# Patient Record
Sex: Female | Born: 1955 | Race: White | Hispanic: No | Marital: Married | Smoking: Never smoker
Health system: Southern US, Community
[De-identification: ages and names within clinical notes are randomized; demographics above are authoritative.]

## PROBLEM LIST (undated history)

## (undated) DIAGNOSIS — IMO0002 Reserved for concepts with insufficient information to code with codable children: Secondary | ICD-10-CM

## (undated) DIAGNOSIS — M21611 Bunion of right foot: Secondary | ICD-10-CM

## (undated) DIAGNOSIS — S83519A Sprain of anterior cruciate ligament of unspecified knee, initial encounter: Secondary | ICD-10-CM

## (undated) DIAGNOSIS — H409 Unspecified glaucoma: Secondary | ICD-10-CM

## (undated) DIAGNOSIS — A64 Unspecified sexually transmitted disease: Secondary | ICD-10-CM

## (undated) DIAGNOSIS — E041 Nontoxic single thyroid nodule: Secondary | ICD-10-CM

## (undated) DIAGNOSIS — M8580 Other specified disorders of bone density and structure, unspecified site: Secondary | ICD-10-CM

## (undated) DIAGNOSIS — M779 Enthesopathy, unspecified: Secondary | ICD-10-CM

## (undated) DIAGNOSIS — M26609 Unspecified temporomandibular joint disorder, unspecified side: Secondary | ICD-10-CM

## (undated) DIAGNOSIS — C44310 Basal cell carcinoma of skin of unspecified parts of face: Secondary | ICD-10-CM

## (undated) DIAGNOSIS — M21612 Bunion of left foot: Secondary | ICD-10-CM

## (undated) DIAGNOSIS — C50919 Malignant neoplasm of unspecified site of unspecified female breast: Secondary | ICD-10-CM

## (undated) DIAGNOSIS — C4491 Basal cell carcinoma of skin, unspecified: Secondary | ICD-10-CM

## (undated) HISTORY — DX: Unspecified glaucoma: H40.9

## (undated) HISTORY — PX: CATARACT EXTRACTION: SUR2

## (undated) HISTORY — DX: Enthesopathy, unspecified: M77.9

## (undated) HISTORY — DX: Basal cell carcinoma of skin, unspecified: C44.91

## (undated) HISTORY — DX: Other specified disorders of bone density and structure, unspecified site: M85.80

## (undated) HISTORY — DX: Nontoxic single thyroid nodule: E04.1

## (undated) HISTORY — DX: Unspecified sexually transmitted disease: A64

## (undated) HISTORY — DX: Sprain of anterior cruciate ligament of unspecified knee, initial encounter: S83.519A

## (undated) HISTORY — DX: Basal cell carcinoma of skin of unspecified parts of face: C44.310

## (undated) HISTORY — PX: EXCISION / BIOPSY BREAST / NIPPLE / DUCT: SUR469

## (undated) HISTORY — DX: Malignant neoplasm of unspecified site of unspecified female breast: C50.919

## (undated) HISTORY — DX: Unspecified temporomandibular joint disorder, unspecified side: M26.609

## (undated) HISTORY — DX: Bunion of right foot: M21.611

## (undated) HISTORY — DX: Reserved for concepts with insufficient information to code with codable children: IMO0002

## (undated) HISTORY — DX: Bunion of left foot: M21.612

---

## 1980-03-03 HISTORY — PX: TENDON REPAIR: SHX5111

## 1980-03-03 HISTORY — PX: WRIST SURGERY: SHX841

## 1985-03-03 HISTORY — PX: DILATION AND CURETTAGE OF UTERUS: SHX78

## 1990-03-03 HISTORY — PX: TUBAL LIGATION: SHX77

## 1992-03-03 DIAGNOSIS — M21611 Bunion of right foot: Secondary | ICD-10-CM

## 1992-03-03 DIAGNOSIS — M21612 Bunion of left foot: Secondary | ICD-10-CM

## 1992-03-03 HISTORY — DX: Bunion of right foot: M21.611

## 1992-03-03 HISTORY — DX: Bunion of right foot: M21.612

## 1992-03-03 HISTORY — PX: OTHER SURGICAL HISTORY: SHX169

## 1998-03-03 DIAGNOSIS — A64 Unspecified sexually transmitted disease: Secondary | ICD-10-CM

## 1998-03-03 HISTORY — DX: Unspecified sexually transmitted disease: A64

## 1998-03-13 ENCOUNTER — Other Ambulatory Visit: Admission: RE | Admit: 1998-03-13 | Discharge: 1998-03-13 | Payer: Self-pay | Admitting: Obstetrics and Gynecology

## 1998-04-27 ENCOUNTER — Encounter: Payer: Self-pay | Admitting: Obstetrics and Gynecology

## 1998-04-27 ENCOUNTER — Ambulatory Visit (HOSPITAL_COMMUNITY): Admission: RE | Admit: 1998-04-27 | Discharge: 1998-04-27 | Payer: Self-pay | Admitting: Obstetrics and Gynecology

## 1999-03-04 HISTORY — PX: ARTHROSCOPIC REPAIR ACL: SUR80

## 1999-04-19 ENCOUNTER — Other Ambulatory Visit: Admission: RE | Admit: 1999-04-19 | Discharge: 1999-04-19 | Payer: Self-pay | Admitting: Obstetrics and Gynecology

## 1999-05-24 ENCOUNTER — Encounter: Admission: RE | Admit: 1999-05-24 | Discharge: 1999-05-24 | Payer: Self-pay | Admitting: Orthopedic Surgery

## 1999-05-24 ENCOUNTER — Encounter: Payer: Self-pay | Admitting: Orthopedic Surgery

## 1999-08-07 ENCOUNTER — Encounter: Payer: Self-pay | Admitting: Obstetrics and Gynecology

## 1999-08-07 ENCOUNTER — Ambulatory Visit (HOSPITAL_COMMUNITY): Admission: RE | Admit: 1999-08-07 | Discharge: 1999-08-07 | Payer: Self-pay | Admitting: Obstetrics and Gynecology

## 1999-08-14 ENCOUNTER — Encounter: Admission: RE | Admit: 1999-08-14 | Discharge: 1999-08-14 | Payer: Self-pay | Admitting: Interventional Cardiology

## 1999-08-14 ENCOUNTER — Encounter: Payer: Self-pay | Admitting: Obstetrics and Gynecology

## 2000-06-08 ENCOUNTER — Other Ambulatory Visit: Admission: RE | Admit: 2000-06-08 | Discharge: 2000-06-08 | Payer: Self-pay | Admitting: Obstetrics and Gynecology

## 2001-06-17 ENCOUNTER — Ambulatory Visit (HOSPITAL_COMMUNITY): Admission: RE | Admit: 2001-06-17 | Discharge: 2001-06-17 | Payer: Self-pay | Admitting: Obstetrics and Gynecology

## 2001-06-17 ENCOUNTER — Encounter: Payer: Self-pay | Admitting: Obstetrics and Gynecology

## 2001-08-17 ENCOUNTER — Other Ambulatory Visit: Admission: RE | Admit: 2001-08-17 | Discharge: 2001-08-17 | Payer: Self-pay | Admitting: Obstetrics and Gynecology

## 2002-06-22 ENCOUNTER — Ambulatory Visit (HOSPITAL_COMMUNITY): Admission: RE | Admit: 2002-06-22 | Discharge: 2002-06-22 | Payer: Self-pay | Admitting: Obstetrics and Gynecology

## 2002-06-22 ENCOUNTER — Encounter: Payer: Self-pay | Admitting: Obstetrics and Gynecology

## 2002-08-19 ENCOUNTER — Other Ambulatory Visit: Admission: RE | Admit: 2002-08-19 | Discharge: 2002-08-19 | Payer: Self-pay | Admitting: Obstetrics and Gynecology

## 2003-08-08 ENCOUNTER — Encounter: Admission: RE | Admit: 2003-08-08 | Discharge: 2003-08-08 | Payer: Self-pay | Admitting: Obstetrics and Gynecology

## 2003-08-21 ENCOUNTER — Other Ambulatory Visit: Admission: RE | Admit: 2003-08-21 | Discharge: 2003-08-21 | Payer: Self-pay | Admitting: Obstetrics and Gynecology

## 2004-08-22 ENCOUNTER — Other Ambulatory Visit: Admission: RE | Admit: 2004-08-22 | Discharge: 2004-08-22 | Payer: Self-pay | Admitting: Obstetrics and Gynecology

## 2005-01-08 ENCOUNTER — Ambulatory Visit (HOSPITAL_COMMUNITY): Admission: RE | Admit: 2005-01-08 | Discharge: 2005-01-08 | Payer: Self-pay | Admitting: Obstetrics and Gynecology

## 2005-08-26 ENCOUNTER — Other Ambulatory Visit: Admission: RE | Admit: 2005-08-26 | Discharge: 2005-08-26 | Payer: Self-pay | Admitting: Obstetrics & Gynecology

## 2006-01-28 ENCOUNTER — Ambulatory Visit (HOSPITAL_COMMUNITY): Admission: RE | Admit: 2006-01-28 | Discharge: 2006-01-28 | Payer: Self-pay | Admitting: Obstetrics and Gynecology

## 2006-02-19 ENCOUNTER — Ambulatory Visit (HOSPITAL_COMMUNITY): Admission: RE | Admit: 2006-02-19 | Discharge: 2006-02-19 | Payer: Self-pay | Admitting: Obstetrics and Gynecology

## 2006-03-23 ENCOUNTER — Encounter: Admission: RE | Admit: 2006-03-23 | Discharge: 2006-03-23 | Payer: Self-pay | Admitting: Obstetrics & Gynecology

## 2006-08-31 ENCOUNTER — Other Ambulatory Visit: Admission: RE | Admit: 2006-08-31 | Discharge: 2006-08-31 | Payer: Self-pay | Admitting: Obstetrics and Gynecology

## 2007-02-01 ENCOUNTER — Ambulatory Visit (HOSPITAL_COMMUNITY): Admission: RE | Admit: 2007-02-01 | Discharge: 2007-02-01 | Payer: Self-pay | Admitting: Obstetrics & Gynecology

## 2007-03-04 DIAGNOSIS — C50919 Malignant neoplasm of unspecified site of unspecified female breast: Secondary | ICD-10-CM

## 2007-03-04 HISTORY — PX: HYSTEROSCOPY WITH D & C: SHX1775

## 2007-03-04 HISTORY — PX: BREAST LUMPECTOMY: SHX2

## 2007-03-04 HISTORY — DX: Malignant neoplasm of unspecified site of unspecified female breast: C50.919

## 2007-03-17 ENCOUNTER — Encounter: Admission: RE | Admit: 2007-03-17 | Discharge: 2007-03-17 | Payer: Self-pay | Admitting: Obstetrics and Gynecology

## 2007-03-29 ENCOUNTER — Encounter (INDEPENDENT_AMBULATORY_CARE_PROVIDER_SITE_OTHER): Payer: Self-pay | Admitting: Diagnostic Radiology

## 2007-03-29 ENCOUNTER — Encounter: Admission: RE | Admit: 2007-03-29 | Discharge: 2007-03-29 | Payer: Self-pay | Admitting: Obstetrics and Gynecology

## 2007-04-08 ENCOUNTER — Encounter: Admission: RE | Admit: 2007-04-08 | Discharge: 2007-04-08 | Payer: Self-pay | Admitting: Obstetrics and Gynecology

## 2007-04-29 ENCOUNTER — Encounter: Admission: RE | Admit: 2007-04-29 | Discharge: 2007-04-29 | Payer: Self-pay | Admitting: Surgery

## 2007-04-30 ENCOUNTER — Encounter (INDEPENDENT_AMBULATORY_CARE_PROVIDER_SITE_OTHER): Payer: Self-pay | Admitting: Surgery

## 2007-04-30 ENCOUNTER — Ambulatory Visit (HOSPITAL_BASED_OUTPATIENT_CLINIC_OR_DEPARTMENT_OTHER): Admission: RE | Admit: 2007-04-30 | Discharge: 2007-04-30 | Payer: Self-pay | Admitting: Surgery

## 2007-04-30 ENCOUNTER — Encounter: Admission: RE | Admit: 2007-04-30 | Discharge: 2007-04-30 | Payer: Self-pay | Admitting: Surgery

## 2007-04-30 HISTORY — PX: BREAST LUMPECTOMY WITH AXILLARY LYMPH NODE BIOPSY: SHX5593

## 2007-05-11 ENCOUNTER — Ambulatory Visit: Payer: Self-pay | Admitting: Oncology

## 2007-05-26 LAB — COMPREHENSIVE METABOLIC PANEL
ALT: 29 U/L (ref 0–35)
AST: 24 U/L (ref 0–37)
Albumin: 4.7 g/dL (ref 3.5–5.2)
Alkaline Phosphatase: 74 U/L (ref 39–117)
BUN: 14 mg/dL (ref 6–23)
CO2: 29 mEq/L (ref 19–32)
Calcium: 9.9 mg/dL (ref 8.4–10.5)
Chloride: 101 mEq/L (ref 96–112)
Creatinine, Ser: 0.85 mg/dL (ref 0.40–1.20)
Glucose, Bld: 90 mg/dL (ref 70–99)
Potassium: 4 mEq/L (ref 3.5–5.3)
Sodium: 136 mEq/L (ref 135–145)
Total Bilirubin: 0.7 mg/dL (ref 0.3–1.2)
Total Protein: 7.3 g/dL (ref 6.0–8.3)

## 2007-05-26 LAB — LACTATE DEHYDROGENASE: LDH: 157 U/L (ref 94–250)

## 2007-05-26 LAB — CBC WITH DIFFERENTIAL/PLATELET
BASO%: 0.3 % (ref 0.0–2.0)
Basophils Absolute: 0 10*3/uL (ref 0.0–0.1)
EOS%: 2.6 % (ref 0.0–7.0)
Eosinophils Absolute: 0.1 10*3/uL (ref 0.0–0.5)
HCT: 39 % (ref 34.8–46.6)
HGB: 13.6 g/dL (ref 11.6–15.9)
LYMPH%: 35.1 % (ref 14.0–48.0)
MCH: 30.9 pg (ref 26.0–34.0)
MCHC: 35 g/dL (ref 32.0–36.0)
MCV: 88.1 fL (ref 81.0–101.0)
MONO#: 0.4 10*3/uL (ref 0.1–0.9)
MONO%: 8.9 % (ref 0.0–13.0)
NEUT#: 2.5 10*3/uL (ref 1.5–6.5)
NEUT%: 53.1 % (ref 39.6–76.8)
Platelets: 187 10*3/uL (ref 145–400)
RBC: 4.43 10*6/uL (ref 3.70–5.32)
RDW: 12.5 % (ref 11.3–14.5)
WBC: 4.8 10*3/uL (ref 3.9–10.0)
lymph#: 1.7 10*3/uL (ref 0.9–3.3)

## 2007-05-26 LAB — CANCER ANTIGEN 27.29: CA 27.29: 20 U/mL (ref 0–39)

## 2007-05-27 LAB — VITAMIN D 25 HYDROXY (VIT D DEFICIENCY, FRACTURES): Vit D, 25-Hydroxy: 36 ng/mL (ref 30–89)

## 2007-05-29 LAB — VITAMIN D 1,25 DIHYDROXY: Vit D, 1,25-Dihydroxy: 84 pg/mL — ABNORMAL HIGH (ref 6–62)

## 2007-06-10 ENCOUNTER — Ambulatory Visit: Admission: RE | Admit: 2007-06-10 | Discharge: 2007-09-08 | Payer: Self-pay | Admitting: Radiation Oncology

## 2007-08-10 ENCOUNTER — Ambulatory Visit: Payer: Self-pay | Admitting: Oncology

## 2007-09-07 ENCOUNTER — Other Ambulatory Visit: Admission: RE | Admit: 2007-09-07 | Discharge: 2007-09-07 | Payer: Self-pay | Admitting: Obstetrics and Gynecology

## 2007-10-29 ENCOUNTER — Ambulatory Visit: Payer: Self-pay | Admitting: Oncology

## 2007-11-02 ENCOUNTER — Ambulatory Visit (HOSPITAL_COMMUNITY): Admission: RE | Admit: 2007-11-02 | Discharge: 2007-11-02 | Payer: Self-pay | Admitting: Obstetrics & Gynecology

## 2007-11-03 LAB — CBC WITH DIFFERENTIAL/PLATELET
BASO%: 0.5 % (ref 0.0–2.0)
Basophils Absolute: 0 10*3/uL (ref 0.0–0.1)
EOS%: 3 % (ref 0.0–7.0)
Eosinophils Absolute: 0.1 10*3/uL (ref 0.0–0.5)
HCT: 39.9 % (ref 34.8–46.6)
HGB: 13.6 g/dL (ref 11.6–15.9)
LYMPH%: 31.6 % (ref 14.0–48.0)
MCH: 30.4 pg (ref 26.0–34.0)
MCHC: 34.1 g/dL (ref 32.0–36.0)
MCV: 89.2 fL (ref 81.0–101.0)
MONO#: 0.3 10*3/uL (ref 0.1–0.9)
MONO%: 8.4 % (ref 0.0–13.0)
NEUT#: 2.1 10*3/uL (ref 1.5–6.5)
NEUT%: 56.5 % (ref 39.6–76.8)
Platelets: 142 10*3/uL — ABNORMAL LOW (ref 145–400)
RBC: 4.47 10*6/uL (ref 3.70–5.32)
RDW: 12.4 % (ref 11.3–14.5)
WBC: 3.7 10*3/uL — ABNORMAL LOW (ref 3.9–10.0)
lymph#: 1.2 10*3/uL (ref 0.9–3.3)

## 2007-11-04 LAB — COMPREHENSIVE METABOLIC PANEL
ALT: 20 U/L (ref 0–35)
AST: 18 U/L (ref 0–37)
Albumin: 4.4 g/dL (ref 3.5–5.2)
Alkaline Phosphatase: 68 U/L (ref 39–117)
BUN: 17 mg/dL (ref 6–23)
CO2: 26 mEq/L (ref 19–32)
Calcium: 9.5 mg/dL (ref 8.4–10.5)
Chloride: 103 mEq/L (ref 96–112)
Creatinine, Ser: 0.92 mg/dL (ref 0.40–1.20)
Glucose, Bld: 88 mg/dL (ref 70–99)
Potassium: 4.3 mEq/L (ref 3.5–5.3)
Sodium: 139 mEq/L (ref 135–145)
Total Bilirubin: 0.7 mg/dL (ref 0.3–1.2)
Total Protein: 6.9 g/dL (ref 6.0–8.3)

## 2007-11-04 LAB — CANCER ANTIGEN 27.29: CA 27.29: 17 U/mL (ref 0–39)

## 2007-11-04 LAB — LACTATE DEHYDROGENASE: LDH: 141 U/L (ref 94–250)

## 2007-11-04 LAB — VITAMIN D 25 HYDROXY (VIT D DEFICIENCY, FRACTURES): Vit D, 25-Hydroxy: 38 ng/mL (ref 30–89)

## 2007-11-10 ENCOUNTER — Ambulatory Visit (HOSPITAL_COMMUNITY): Admission: RE | Admit: 2007-11-10 | Discharge: 2007-11-10 | Payer: Self-pay | Admitting: Oncology

## 2008-02-23 ENCOUNTER — Encounter: Admission: RE | Admit: 2008-02-23 | Discharge: 2008-02-23 | Payer: Self-pay | Admitting: Oncology

## 2008-05-03 ENCOUNTER — Ambulatory Visit: Payer: Self-pay | Admitting: Oncology

## 2008-05-05 LAB — CBC WITH DIFFERENTIAL/PLATELET
BASO%: 0.5 % (ref 0.0–2.0)
Basophils Absolute: 0 10*3/uL (ref 0.0–0.1)
EOS%: 1.2 % (ref 0.0–7.0)
Eosinophils Absolute: 0.1 10*3/uL (ref 0.0–0.5)
HCT: 36.3 % (ref 34.8–46.6)
HGB: 12.3 g/dL (ref 11.6–15.9)
LYMPH%: 36 % (ref 14.0–49.7)
MCH: 30.3 pg (ref 25.1–34.0)
MCHC: 34 g/dL (ref 31.5–36.0)
MCV: 89.4 fL (ref 79.5–101.0)
MONO#: 0.3 10*3/uL (ref 0.1–0.9)
MONO%: 6.4 % (ref 0.0–14.0)
NEUT#: 2.9 10*3/uL (ref 1.5–6.5)
NEUT%: 55.9 % (ref 38.4–76.8)
Platelets: 176 10*3/uL (ref 145–400)
RBC: 4.06 10*6/uL (ref 3.70–5.45)
RDW: 12.2 % (ref 11.2–14.5)
WBC: 5.1 10*3/uL (ref 3.9–10.3)
lymph#: 1.9 10*3/uL (ref 0.9–3.3)

## 2008-05-08 LAB — CANCER ANTIGEN 27.29: CA 27.29: 20 U/mL (ref 0–39)

## 2008-05-08 LAB — LACTATE DEHYDROGENASE: LDH: 144 U/L (ref 94–250)

## 2008-05-08 LAB — COMPREHENSIVE METABOLIC PANEL
ALT: 20 U/L (ref 0–35)
AST: 20 U/L (ref 0–37)
Albumin: 4.4 g/dL (ref 3.5–5.2)
Alkaline Phosphatase: 77 U/L (ref 39–117)
BUN: 13 mg/dL (ref 6–23)
CO2: 27 mEq/L (ref 19–32)
Calcium: 9.4 mg/dL (ref 8.4–10.5)
Chloride: 102 mEq/L (ref 96–112)
Creatinine, Ser: 0.83 mg/dL (ref 0.40–1.20)
Glucose, Bld: 122 mg/dL — ABNORMAL HIGH (ref 70–99)
Potassium: 4 mEq/L (ref 3.5–5.3)
Sodium: 141 mEq/L (ref 135–145)
Total Bilirubin: 0.4 mg/dL (ref 0.3–1.2)
Total Protein: 6.6 g/dL (ref 6.0–8.3)

## 2008-05-08 LAB — VITAMIN D 25 HYDROXY (VIT D DEFICIENCY, FRACTURES): Vit D, 25-Hydroxy: 46 ng/mL (ref 30–89)

## 2008-10-03 ENCOUNTER — Encounter: Admission: RE | Admit: 2008-10-03 | Discharge: 2008-11-21 | Payer: Self-pay | Admitting: Emergency Medicine

## 2008-11-10 ENCOUNTER — Ambulatory Visit: Payer: Self-pay | Admitting: Oncology

## 2008-11-14 LAB — CBC WITH DIFFERENTIAL/PLATELET
BASO%: 0.7 % (ref 0.0–2.0)
Basophils Absolute: 0 10*3/uL (ref 0.0–0.1)
EOS%: 1.3 % (ref 0.0–7.0)
Eosinophils Absolute: 0.1 10*3/uL (ref 0.0–0.5)
HCT: 39.4 % (ref 34.8–46.6)
HGB: 13.5 g/dL (ref 11.6–15.9)
LYMPH%: 42.1 % (ref 14.0–49.7)
MCH: 29.7 pg (ref 25.1–34.0)
MCHC: 34.3 g/dL (ref 31.5–36.0)
MCV: 86.6 fL (ref 79.5–101.0)
MONO#: 0.3 10*3/uL (ref 0.1–0.9)
MONO%: 7.1 % (ref 0.0–14.0)
NEUT#: 2.2 10*3/uL (ref 1.5–6.5)
NEUT%: 48.8 % (ref 38.4–76.8)
Platelets: 167 10*3/uL (ref 145–400)
RBC: 4.55 10*6/uL (ref 3.70–5.45)
RDW: 12.6 % (ref 11.2–14.5)
WBC: 4.5 10*3/uL (ref 3.9–10.3)
lymph#: 1.9 10*3/uL (ref 0.9–3.3)
nRBC: 0 % (ref 0–0)

## 2008-11-15 LAB — COMPREHENSIVE METABOLIC PANEL
ALT: 20 U/L (ref 0–35)
AST: 20 U/L (ref 0–37)
Albumin: 4.6 g/dL (ref 3.5–5.2)
Alkaline Phosphatase: 71 U/L (ref 39–117)
BUN: 13 mg/dL (ref 6–23)
CO2: 26 mEq/L (ref 19–32)
Calcium: 9.2 mg/dL (ref 8.4–10.5)
Chloride: 104 mEq/L (ref 96–112)
Creatinine, Ser: 0.98 mg/dL (ref 0.40–1.20)
Glucose, Bld: 103 mg/dL — ABNORMAL HIGH (ref 70–99)
Potassium: 3.7 mEq/L (ref 3.5–5.3)
Sodium: 142 mEq/L (ref 135–145)
Total Bilirubin: 0.6 mg/dL (ref 0.3–1.2)
Total Protein: 7.1 g/dL (ref 6.0–8.3)

## 2008-11-15 LAB — VITAMIN D 25 HYDROXY (VIT D DEFICIENCY, FRACTURES): Vit D, 25-Hydroxy: 54 ng/mL (ref 30–89)

## 2008-11-15 LAB — LACTATE DEHYDROGENASE: LDH: 150 U/L (ref 94–250)

## 2008-11-15 LAB — CANCER ANTIGEN 27.29: CA 27.29: 21 U/mL (ref 0–39)

## 2009-02-26 ENCOUNTER — Encounter: Admission: RE | Admit: 2009-02-26 | Discharge: 2009-02-26 | Payer: Self-pay | Admitting: Oncology

## 2009-03-26 ENCOUNTER — Ambulatory Visit (HOSPITAL_COMMUNITY): Admission: RE | Admit: 2009-03-26 | Discharge: 2009-03-26 | Payer: Self-pay | Admitting: Obstetrics & Gynecology

## 2009-05-11 ENCOUNTER — Ambulatory Visit: Payer: Self-pay | Admitting: Oncology

## 2009-05-15 LAB — CBC WITH DIFFERENTIAL/PLATELET
BASO%: 0.3 % (ref 0.0–2.0)
Basophils Absolute: 0 10*3/uL (ref 0.0–0.1)
EOS%: 0.5 % (ref 0.0–7.0)
Eosinophils Absolute: 0 10*3/uL (ref 0.0–0.5)
HCT: 38.4 % (ref 34.8–46.6)
HGB: 13.1 g/dL (ref 11.6–15.9)
LYMPH%: 19 % (ref 14.0–49.7)
MCH: 31 pg (ref 25.1–34.0)
MCHC: 34 g/dL (ref 31.5–36.0)
MCV: 91 fL (ref 79.5–101.0)
MONO#: 0.4 10*3/uL (ref 0.1–0.9)
MONO%: 5.4 % (ref 0.0–14.0)
NEUT#: 5.8 10*3/uL (ref 1.5–6.5)
NEUT%: 74.8 % (ref 38.4–76.8)
Platelets: 151 10*3/uL (ref 145–400)
RBC: 4.22 10*6/uL (ref 3.70–5.45)
RDW: 12.2 % (ref 11.2–14.5)
WBC: 7.8 10*3/uL (ref 3.9–10.3)
lymph#: 1.5 10*3/uL (ref 0.9–3.3)

## 2009-05-15 LAB — COMPREHENSIVE METABOLIC PANEL
ALT: 16 U/L (ref 0–35)
AST: 21 U/L (ref 0–37)
Albumin: 4.8 g/dL (ref 3.5–5.2)
Alkaline Phosphatase: 77 U/L (ref 39–117)
BUN: 13 mg/dL (ref 6–23)
CO2: 26 mEq/L (ref 19–32)
Calcium: 9.7 mg/dL (ref 8.4–10.5)
Chloride: 103 mEq/L (ref 96–112)
Creatinine, Ser: 0.79 mg/dL (ref 0.40–1.20)
Glucose, Bld: 85 mg/dL (ref 70–99)
Potassium: 4 mEq/L (ref 3.5–5.3)
Sodium: 140 mEq/L (ref 135–145)
Total Bilirubin: 0.7 mg/dL (ref 0.3–1.2)
Total Protein: 7.1 g/dL (ref 6.0–8.3)

## 2009-05-15 LAB — VITAMIN D 25 HYDROXY (VIT D DEFICIENCY, FRACTURES): Vit D, 25-Hydroxy: 54 ng/mL (ref 30–89)

## 2009-05-15 LAB — CANCER ANTIGEN 27.29: CA 27.29: 11 U/mL (ref 0–39)

## 2009-05-15 LAB — LACTATE DEHYDROGENASE: LDH: 159 U/L (ref 94–250)

## 2009-12-19 ENCOUNTER — Ambulatory Visit: Payer: Self-pay | Admitting: Oncology

## 2009-12-21 LAB — CBC WITH DIFFERENTIAL/PLATELET
BASO%: 1 % (ref 0.0–2.0)
Basophils Absolute: 0 10*3/uL (ref 0.0–0.1)
EOS%: 2.5 % (ref 0.0–7.0)
Eosinophils Absolute: 0.1 10*3/uL (ref 0.0–0.5)
HCT: 39.4 % (ref 34.8–46.6)
HGB: 13.5 g/dL (ref 11.6–15.9)
LYMPH%: 41.5 % (ref 14.0–49.7)
MCH: 30.8 pg (ref 25.1–34.0)
MCHC: 34.2 g/dL (ref 31.5–36.0)
MCV: 90 fL (ref 79.5–101.0)
MONO#: 0.3 10*3/uL (ref 0.1–0.9)
MONO%: 8.4 % (ref 0.0–14.0)
NEUT#: 1.9 10*3/uL (ref 1.5–6.5)
NEUT%: 46.6 % (ref 38.4–76.8)
Platelets: 173 10*3/uL (ref 145–400)
RBC: 4.38 10*6/uL (ref 3.70–5.45)
RDW: 12.5 % (ref 11.2–14.5)
WBC: 4.1 10*3/uL (ref 3.9–10.3)
lymph#: 1.7 10*3/uL (ref 0.9–3.3)

## 2009-12-22 LAB — COMPREHENSIVE METABOLIC PANEL
ALT: 14 U/L (ref 0–35)
AST: 19 U/L (ref 0–37)
Albumin: 4.3 g/dL (ref 3.5–5.2)
Alkaline Phosphatase: 81 U/L (ref 39–117)
BUN: 15 mg/dL (ref 6–23)
CO2: 25 mEq/L (ref 19–32)
Calcium: 9.7 mg/dL (ref 8.4–10.5)
Chloride: 105 mEq/L (ref 96–112)
Creatinine, Ser: 1 mg/dL (ref 0.40–1.20)
Glucose, Bld: 71 mg/dL (ref 70–99)
Potassium: 4.1 mEq/L (ref 3.5–5.3)
Sodium: 144 mEq/L (ref 135–145)
Total Bilirubin: 0.5 mg/dL (ref 0.3–1.2)
Total Protein: 6.5 g/dL (ref 6.0–8.3)

## 2009-12-22 LAB — CANCER ANTIGEN 27.29: CA 27.29: 18 U/mL (ref 0–39)

## 2009-12-22 LAB — LACTATE DEHYDROGENASE: LDH: 145 U/L (ref 94–250)

## 2009-12-22 LAB — VITAMIN D 25 HYDROXY (VIT D DEFICIENCY, FRACTURES): Vit D, 25-Hydroxy: 62 ng/mL (ref 30–89)

## 2010-03-06 ENCOUNTER — Encounter
Admission: RE | Admit: 2010-03-06 | Discharge: 2010-03-06 | Payer: Self-pay | Source: Home / Self Care | Attending: Oncology | Admitting: Oncology

## 2010-03-24 ENCOUNTER — Encounter: Payer: Self-pay | Admitting: General Surgery

## 2010-03-24 ENCOUNTER — Encounter: Payer: Self-pay | Admitting: Oncology

## 2010-05-20 LAB — URINALYSIS, ROUTINE W REFLEX MICROSCOPIC
Bilirubin Urine: NEGATIVE
Glucose, UA: NEGATIVE mg/dL
Hgb urine dipstick: NEGATIVE
Ketones, ur: NEGATIVE mg/dL
Nitrite: NEGATIVE
Protein, ur: NEGATIVE mg/dL
Specific Gravity, Urine: 1.02 (ref 1.005–1.030)
Urobilinogen, UA: 0.2 mg/dL (ref 0.0–1.0)
pH: 5.5 (ref 5.0–8.0)

## 2010-05-20 LAB — CBC
HCT: 38.2 % (ref 36.0–46.0)
Hemoglobin: 12.8 g/dL (ref 12.0–15.0)
MCHC: 33.6 g/dL (ref 30.0–36.0)
MCV: 90.9 fL (ref 78.0–100.0)
Platelets: 141 10*3/uL — ABNORMAL LOW (ref 150–400)
RBC: 4.2 MIL/uL (ref 3.87–5.11)
RDW: 13 % (ref 11.5–15.5)
WBC: 4.1 10*3/uL (ref 4.0–10.5)

## 2010-05-20 LAB — BASIC METABOLIC PANEL
BUN: 13 mg/dL (ref 6–23)
CO2: 29 mEq/L (ref 19–32)
Calcium: 9.7 mg/dL (ref 8.4–10.5)
Chloride: 104 mEq/L (ref 96–112)
Creatinine, Ser: 0.78 mg/dL (ref 0.4–1.2)
GFR calc Af Amer: >60 mL/min (ref >60)
GFR calc non Af Amer: >60 mL/min (ref >60)
Glucose, Bld: 93 mg/dL (ref 70–99)
Potassium: 4.2 mEq/L (ref 3.5–5.1)
Sodium: 140 mEq/L (ref 135–145)

## 2010-05-20 LAB — URINE MICROSCOPIC-ADD ON

## 2010-06-03 LAB — URINALYSIS, ROUTINE W REFLEX MICROSCOPIC
Bilirubin Urine: NEGATIVE
Glucose, UA: NEGATIVE mg/dL
Hgb urine dipstick: NEGATIVE
Ketones, ur: NEGATIVE mg/dL
Nitrite: NEGATIVE
Protein, ur: NEGATIVE mg/dL
Specific Gravity, Urine: 1.01 (ref 1.005–1.030)
Urobilinogen, UA: 0.2 mg/dL (ref 0.0–1.0)
pH: 7 (ref 5.0–8.0)

## 2010-06-03 LAB — BASIC METABOLIC PANEL
BUN: 14 mg/dL (ref 6–23)
CO2: 29 mEq/L (ref 19–32)
Calcium: 9.4 mg/dL (ref 8.4–10.5)
Chloride: 103 mEq/L (ref 96–112)
Creatinine, Ser: 0.78 mg/dL (ref 0.4–1.2)
GFR calc Af Amer: >60 mL/min (ref >60)
GFR calc non Af Amer: >60 mL/min (ref >60)
Glucose, Bld: 89 mg/dL (ref 70–99)
Potassium: 3.7 mEq/L (ref 3.5–5.1)
Sodium: 137 mEq/L (ref 135–145)

## 2010-06-03 LAB — CBC
HCT: 38 % (ref 36.0–46.0)
Hemoglobin: 12.7 g/dL (ref 12.0–15.0)
MCHC: 33.4 g/dL (ref 30.0–36.0)
MCV: 90.6 fL (ref 78.0–100.0)
Platelets: 148 10*3/uL — ABNORMAL LOW (ref 150–400)
RBC: 4.2 MIL/uL (ref 3.87–5.11)
RDW: 12.8 % (ref 11.5–15.5)
WBC: 4.6 10*3/uL (ref 4.0–10.5)

## 2010-07-04 ENCOUNTER — Other Ambulatory Visit: Payer: Self-pay | Admitting: Oncology

## 2010-07-04 ENCOUNTER — Encounter (HOSPITAL_BASED_OUTPATIENT_CLINIC_OR_DEPARTMENT_OTHER): Payer: BC Managed Care – PPO | Admitting: Oncology

## 2010-07-04 DIAGNOSIS — Z170 Estrogen receptor positive status [ER+]: Secondary | ICD-10-CM

## 2010-07-04 DIAGNOSIS — C50519 Malignant neoplasm of lower-outer quadrant of unspecified female breast: Secondary | ICD-10-CM

## 2010-07-04 DIAGNOSIS — IMO0002 Reserved for concepts with insufficient information to code with codable children: Secondary | ICD-10-CM

## 2010-07-04 LAB — CBC WITH DIFFERENTIAL/PLATELET
BASO%: 0.6 % (ref 0.0–2.0)
Basophils Absolute: 0 10*3/uL (ref 0.0–0.1)
EOS%: 1 % (ref 0.0–7.0)
Eosinophils Absolute: 0.1 10*3/uL (ref 0.0–0.5)
HCT: 36.8 % (ref 34.8–46.6)
HGB: 12.5 g/dL (ref 11.6–15.9)
LYMPH%: 29.2 % (ref 14.0–49.7)
MCH: 31.1 pg (ref 25.1–34.0)
MCHC: 34 g/dL (ref 31.5–36.0)
MCV: 91.4 fL (ref 79.5–101.0)
MONO#: 0.5 10*3/uL (ref 0.1–0.9)
MONO%: 7.9 % (ref 0.0–14.0)
NEUT#: 4 10*3/uL (ref 1.5–6.5)
NEUT%: 61.3 % (ref 38.4–76.8)
Platelets: 141 10*3/uL — ABNORMAL LOW (ref 145–400)
RBC: 4.02 10*6/uL (ref 3.70–5.45)
RDW: 12.6 % (ref 11.2–14.5)
WBC: 6.5 10*3/uL (ref 3.9–10.3)
lymph#: 1.9 10*3/uL (ref 0.9–3.3)

## 2010-07-05 LAB — COMPREHENSIVE METABOLIC PANEL
ALT: 14 U/L (ref 0–35)
AST: 20 U/L (ref 0–37)
Albumin: 4.2 g/dL (ref 3.5–5.2)
Alkaline Phosphatase: 58 U/L (ref 39–117)
BUN: 13 mg/dL (ref 6–23)
CO2: 27 mEq/L (ref 19–32)
Calcium: 9.4 mg/dL (ref 8.4–10.5)
Chloride: 105 mEq/L (ref 96–112)
Creatinine, Ser: 0.85 mg/dL (ref 0.40–1.20)
Glucose, Bld: 86 mg/dL (ref 70–99)
Potassium: 4.4 mEq/L (ref 3.5–5.3)
Sodium: 143 mEq/L (ref 135–145)
Total Bilirubin: 0.4 mg/dL (ref 0.3–1.2)
Total Protein: 6.5 g/dL (ref 6.0–8.3)

## 2010-07-05 LAB — VITAMIN D 25 HYDROXY (VIT D DEFICIENCY, FRACTURES): Vit D, 25-Hydroxy: 54 ng/mL (ref 30–89)

## 2010-07-05 LAB — CANCER ANTIGEN 27.29: CA 27.29: 15 U/mL (ref 0–39)

## 2010-07-09 ENCOUNTER — Encounter (HOSPITAL_BASED_OUTPATIENT_CLINIC_OR_DEPARTMENT_OTHER): Payer: BC Managed Care – PPO | Admitting: Oncology

## 2010-07-16 NOTE — Op Note (Signed)
Michele Cabrera, Michele Cabrera               ACCOUNT NO.:  [id number]   MEDICAL RECORD NO.:  [id number]          PATIENT TYPE:  AMB   LOCATION:  DSC                          FACILITY:  [facility]   PHYSICIAN:  Thomas A. Cornett, M.D.DATE OF BIRTH:  [birth year]   DATE OF PROCEDURE:  04/30/2007  DATE OF DISCHARGE:                               OPERATIVE REPORT   PREOPERATIVE DIAGNOSIS:  Left breast cancer.   POSTOPERATIVE DIAGNOSIS:  Left breast cancer.   PROCEDURES:  1. Left breast needle-localized lumpectomy.  2. Left axillary sentinel lymph node mapping, with injection of      methylene blue dye.   SURGEON:  Maisie Fus A. Cornett, M.D.   ANESTHESIA:  General endotracheal anesthesia, with 0.25% Sensorcaine  local.   ESTIMATED BLOOD LOSS:  20 mL.   SPECIMEN:  1. Left breast mass with localizing wire to pathology.  2. Additional lateral margin.  3. Five sentinel lymph nodes, negative by touch prep.   DRAINS:  None.   INDICATIONS FOR PROCEDURE:  The patient is a 55 year old female, found  to have a roughly 1.5-cm left breast cancer in the left lower outer  quadrant.  She was scheduled for surgery by Dr. Earlene Plater, but unfortunately  he became ill and I assumed the care at that point.  She is interested  in breast conserving surgery, and presented today for lumpectomy and  sentinel lymph node mapping on the left.  Informed consent was obtained.   DESCRIPTION OF PROCEDURE:  After undergoing left breast needle  localization and left breast injection with technetium sulfur colloid,  the patient was brought to the operating room.  She was then placed  under general anesthesia.  The left breast was then prepped and draped  in sterile fashion.  Then 4 mL of methylene blue dye mixed with saline  were injected into subareolar position and massaged for 5 minutes.  After sterile prep and drape, the sentinel node was done first.  Incision was made, after using the NeoProbe to identify the hot spot.  We then  dissected in the lingula and found the first 2 nodes that were  blue and hot, and then 2 more that were hot but not blue; and the last  node was enlarged so I sent it.  All these nodes were negative by touch  prep.  Hemostasis was excellent.   Next, the lumpectomy was done.  The wire was in the lower outer quadrant  of the left breast.  A curvilinear incision was made with the contour of  the breast.  The wire was brought out through the incision; and the  tissue in the tip of the wire was excised.  The lateral superficial  margin was close, so I re-excised this area, to make sure we had more  than adequate margin in that area.  Radiograph revealed the tumor to be  in the specimen.  She had small breasts, so I decided not to close the  deep layers of the breast.  I let this fill with fluid to keep the  contour of her breast intact.  Irrigation was used and  suctioned out.  Monocryl 4-0 was used to close the skin.  We then closed the axilla with  3-0 Vicryl and 4-0 Monocryl.  Dermabond was applied to both incisions.  All final counts of sponge, needle and instruments were found be correct  at this portion of the case.   The patient was then awakened and taken to recovery in satisfactory  condition.      Thomas A. Cornett, M.D.  Electronically Signed     TAC/MEDQ  D:  04/30/2007  T:  05/01/2007  Job:  [id number]   cc:   Oley Balm. Georgina Pillion, M.D.  Lum Keas, MD

## 2010-11-22 LAB — DIFFERENTIAL
Basophils Absolute: 0
Basophils Relative: 1
Eosinophils Absolute: 0.1
Eosinophils Relative: 2
Lymphocytes Relative: 37
Lymphs Abs: 1.7
Monocytes Absolute: 0.4
Monocytes Relative: 8
Neutro Abs: 2.3
Neutrophils Relative %: 52

## 2010-11-22 LAB — COMPREHENSIVE METABOLIC PANEL
ALT: 28
AST: 25
Albumin: 4.1
Alkaline Phosphatase: 54
BUN: 10
CO2: 30
Calcium: 9.5
Chloride: 104
Creatinine, Ser: 0.92
GFR calc Af Amer: >60
GFR calc non Af Amer: >60
Glucose, Bld: 68 — ABNORMAL LOW
Potassium: 4.6
Sodium: 139
Total Bilirubin: 0.6
Total Protein: 6.5

## 2010-11-22 LAB — CBC
HCT: 39.4
Hemoglobin: 13.5
MCHC: 34.2
MCV: 89.2
Platelets: 148 — ABNORMAL LOW
RBC: 4.42
RDW: 12.4
WBC: 4.5

## 2010-12-31 ENCOUNTER — Telehealth: Payer: Self-pay | Admitting: *Deleted

## 2011-01-02 ENCOUNTER — Encounter (HOSPITAL_BASED_OUTPATIENT_CLINIC_OR_DEPARTMENT_OTHER): Payer: BC Managed Care – PPO | Admitting: Oncology

## 2011-01-02 ENCOUNTER — Other Ambulatory Visit: Payer: Self-pay | Admitting: Oncology

## 2011-01-02 DIAGNOSIS — Z170 Estrogen receptor positive status [ER+]: Secondary | ICD-10-CM

## 2011-01-02 DIAGNOSIS — C50519 Malignant neoplasm of lower-outer quadrant of unspecified female breast: Secondary | ICD-10-CM

## 2011-01-02 LAB — CBC WITH DIFFERENTIAL/PLATELET
BASO%: 0.6 % (ref 0.0–2.0)
Basophils Absolute: 0 10*3/uL (ref 0.0–0.1)
EOS%: 1.5 % (ref 0.0–7.0)
Eosinophils Absolute: 0.1 10*3/uL (ref 0.0–0.5)
HCT: 37.8 % (ref 34.8–46.6)
HGB: 12.7 g/dL (ref 11.6–15.9)
LYMPH%: 34.1 % (ref 14.0–49.7)
MCH: 30.7 pg (ref 25.1–34.0)
MCHC: 33.7 g/dL (ref 31.5–36.0)
MCV: 91.2 fL (ref 79.5–101.0)
MONO#: 0.4 10*3/uL (ref 0.1–0.9)
MONO%: 10.9 % (ref 0.0–14.0)
NEUT#: 2 10*3/uL (ref 1.5–6.5)
NEUT%: 52.9 % (ref 38.4–76.8)
Platelets: 140 10*3/uL — ABNORMAL LOW (ref 145–400)
RBC: 4.14 10*6/uL (ref 3.70–5.45)
RDW: 12.8 % (ref 11.2–14.5)
WBC: 3.8 10*3/uL — ABNORMAL LOW (ref 3.9–10.3)
lymph#: 1.3 10*3/uL (ref 0.9–3.3)

## 2011-01-03 LAB — COMPREHENSIVE METABOLIC PANEL
ALT: 27 U/L (ref 0–35)
AST: 27 U/L (ref 0–37)
Albumin: 4.2 g/dL (ref 3.5–5.2)
Alkaline Phosphatase: 55 U/L (ref 39–117)
BUN: 12 mg/dL (ref 6–23)
CO2: 28 mEq/L (ref 19–32)
Calcium: 9.7 mg/dL (ref 8.4–10.5)
Chloride: 104 mEq/L (ref 96–112)
Creatinine, Ser: 1.05 mg/dL (ref 0.50–1.10)
Glucose, Bld: 101 mg/dL — ABNORMAL HIGH (ref 70–99)
Potassium: 4.1 mEq/L (ref 3.5–5.3)
Sodium: 141 mEq/L (ref 135–145)
Total Bilirubin: 0.4 mg/dL (ref 0.3–1.2)
Total Protein: 6.6 g/dL (ref 6.0–8.3)

## 2011-01-03 LAB — VITAMIN D 25 HYDROXY (VIT D DEFICIENCY, FRACTURES): Vit D, 25-Hydroxy: 57 ng/mL (ref 30–89)

## 2011-01-03 LAB — CANCER ANTIGEN 27.29: CA 27.29: 15 U/mL (ref 0–39)

## 2011-01-24 ENCOUNTER — Other Ambulatory Visit: Payer: Self-pay | Admitting: *Deleted

## 2011-01-24 DIAGNOSIS — C50519 Malignant neoplasm of lower-outer quadrant of unspecified female breast: Secondary | ICD-10-CM

## 2011-01-24 MED ORDER — TAMOXIFEN CITRATE 20 MG PO TABS: 20.0000 mg | ORAL_TABLET | Freq: Every day | ORAL | Status: AC

## 2011-01-27 ENCOUNTER — Ambulatory Visit (HOSPITAL_BASED_OUTPATIENT_CLINIC_OR_DEPARTMENT_OTHER): Payer: BC Managed Care – PPO | Admitting: Oncology

## 2011-01-27 ENCOUNTER — Telehealth: Payer: Self-pay | Admitting: *Deleted

## 2011-01-27 DIAGNOSIS — M899 Disorder of bone, unspecified: Secondary | ICD-10-CM

## 2011-01-27 DIAGNOSIS — C50919 Malignant neoplasm of unspecified site of unspecified female breast: Secondary | ICD-10-CM

## 2011-01-27 DIAGNOSIS — Z170 Estrogen receptor positive status [ER+]: Secondary | ICD-10-CM

## 2011-01-27 DIAGNOSIS — C50519 Malignant neoplasm of lower-outer quadrant of unspecified female breast: Secondary | ICD-10-CM

## 2011-01-27 DIAGNOSIS — Z1231 Encounter for screening mammogram for malignant neoplasm of breast: Secondary | ICD-10-CM

## 2011-01-27 DIAGNOSIS — E559 Vitamin D deficiency, unspecified: Secondary | ICD-10-CM

## 2011-01-27 NOTE — Telephone Encounter (Signed)
gave patient appointment for 07-2011 

## 2011-01-27 NOTE — Progress Notes (Signed)
Hematology and Oncology Follow Up Visit  Michele Cabrera [id number] Jul 12, 1955 55 y.o. 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  Interim History:  Patient is doing well. 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 She has had no intercurrent problems. She feels much better on the tamoxifen but finds that her bowels are little more sluggish. As mammogram was within normal limits A. bone density test done early 2012 shows mild osteopenia in the hip and normal bone density in the spine.  Medications: I have reviewed the patient's current medications.  Allergies:  Allergies  Allergen Reactions  . Penicillins Hives  . Sulfa Antibiotics Hives    Past Medical History, Surgical history, Social history, and Family History were reviewed and updated.  Review of Systems: Constitutional:  Negative for fever, chills, night sweats, anorexia, weight loss, pain. Cardiovascular: no chest pain or dyspnea on exertion Respiratory: no cough, shortness of breath, or wheezing Neurological: no TIA or stroke symptoms Dermatological: negative ENT: negative Skin Gastrointestinal: no abdominal pain, sluggish  bowel habits, or black or bloody stools Genito-Urinary: no dysuria, trouble voiding, or hematuria Hematological and Lymphatic: negative Breast: negative for breast lumps Musculoskeletal: negative Remaining ROS negative.  Physical Exam: Blood pressure 119/69, pulse 69, temperature 98.7 F (37.1 C), temperature source Oral, height 5' 8.5" (1.74 m), weight 135 lb 9.6 oz (61.508 kg). ECOG: 0 General appearance: alert, cooperative and appears stated age Head: Normocephalic, without obvious abnormality, atraumatic Neck: no adenopathy, no  carotid bruit, no JVD, supple, symmetrical, trachea midline and thyroid not enlarged, symmetric, no tenderness/mass/nodules Lymph nodes: Cervical, supraclavicular, and axillary nodes normal. Cardiac : Normal heart sounds  Pulmonary: Normal Breasts: Right and left breasts are free of any masses. The left breast is status post lumpectomy with a healed surgical scar and no evidence of local recurrence. Both axilla negative for adenopathy. Abdomen: Normal Extremities normal Neuro: Normal  Lab Results: Lab Results  Component Value Date   WBC 3.8* 01/02/2011   HGB 12.7 01/02/2011   HCT 37.8 01/02/2011   MCV 91.2 01/02/2011   PLT 140* 01/02/2011     Chemistry      Component Value Date/Time   NA 141 01/02/2011 1426   NA 141 01/02/2011 1426   NA 141 01/02/2011 1426   K 4.1 01/02/2011 1426   K 4.1 01/02/2011 1426   K 4.1 01/02/2011 1426   CL 104 01/02/2011 1426   CL 104 01/02/2011 1426   CL 104 01/02/2011 1426   CO2 28 01/02/2011 1426   CO2 28 01/02/2011 1426   CO2 28 01/02/2011 1426   BUN 12 01/02/2011 1426   BUN 12 01/02/2011 1426   BUN 12 01/02/2011 1426   CREATININE 1.05 01/02/2011 1426   CREATININE 1.05 01/02/2011 1426   CREATININE 1.05 01/02/2011 1426      Component Value Date/Time   CALCIUM 9.7 01/02/2011 1426   CALCIUM 9.7 01/02/2011 1426   CALCIUM 9.7 01/02/2011 1426   ALKPHOS 55 01/02/2011 1426   ALKPHOS 55 01/02/2011 1426   ALKPHOS 55 01/02/2011 1426   AST 27 01/02/2011 1426   AST 27 01/02/2011 1426   AST 27 01/02/2011 1426   ALT 27 01/02/2011 1426   ALT 27 01/02/2011 1426   ALT 27 01/02/2011  1426   BILITOT 0.4 01/02/2011 1426   BILITOT 0.4 01/02/2011 1426   BILITOT 0.4 01/02/2011 1426       Radiological Studies: chest X-ray n/a Mammogram Last one 5/12-nl Bone density As described  Impression and Plan: +55 year old woman 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 There is no clinical evidence of recurrence I will see her  in 6 months with appropriate imaging studies.  More than 50% of the visit was spent in patient-related counselling   Pierce Crane, MD 11/26/20123:41 PM

## 2011-02-26 ENCOUNTER — Other Ambulatory Visit: Payer: Self-pay | Admitting: *Deleted

## 2011-02-26 DIAGNOSIS — C50519 Malignant neoplasm of lower-outer quadrant of unspecified female breast: Secondary | ICD-10-CM

## 2011-02-26 MED ORDER — TAMOXIFEN CITRATE 20 MG PO TABS: 20.0000 mg | ORAL_TABLET | Freq: Every day | ORAL | Status: AC

## 2011-03-10 ENCOUNTER — Ambulatory Visit
Admission: RE | Admit: 2011-03-10 | Discharge: 2011-03-10 | Disposition: A | Discharge status: Home / Self Care | Payer: BC Managed Care – PPO | Source: Ambulatory Visit | Attending: Oncology | Admitting: Oncology

## 2011-03-10 DIAGNOSIS — E559 Vitamin D deficiency, unspecified: Secondary | ICD-10-CM

## 2011-03-10 DIAGNOSIS — Z1231 Encounter for screening mammogram for malignant neoplasm of breast: Secondary | ICD-10-CM

## 2011-03-10 DIAGNOSIS — C50919 Malignant neoplasm of unspecified site of unspecified female breast: Secondary | ICD-10-CM

## 2011-07-29 ENCOUNTER — Ambulatory Visit (HOSPITAL_BASED_OUTPATIENT_CLINIC_OR_DEPARTMENT_OTHER): Payer: BC Managed Care – PPO | Admitting: Oncology

## 2011-07-29 ENCOUNTER — Other Ambulatory Visit: Payer: Self-pay | Admitting: *Deleted

## 2011-07-29 ENCOUNTER — Other Ambulatory Visit (HOSPITAL_BASED_OUTPATIENT_CLINIC_OR_DEPARTMENT_OTHER): Payer: BC Managed Care – PPO | Admitting: Lab

## 2011-07-29 VITALS — BP 109/68 | HR 80 | Temp 98.2°F | Ht 68.5 in | Wt 136.5 lb

## 2011-07-29 DIAGNOSIS — M899 Disorder of bone, unspecified: Secondary | ICD-10-CM

## 2011-07-29 DIAGNOSIS — Z170 Estrogen receptor positive status [ER+]: Secondary | ICD-10-CM

## 2011-07-29 DIAGNOSIS — M949 Disorder of cartilage, unspecified: Secondary | ICD-10-CM

## 2011-07-29 DIAGNOSIS — E559 Vitamin D deficiency, unspecified: Secondary | ICD-10-CM

## 2011-07-29 DIAGNOSIS — C50519 Malignant neoplasm of lower-outer quadrant of unspecified female breast: Secondary | ICD-10-CM

## 2011-07-29 DIAGNOSIS — C50919 Malignant neoplasm of unspecified site of unspecified female breast: Secondary | ICD-10-CM

## 2011-07-29 DIAGNOSIS — Z1231 Encounter for screening mammogram for malignant neoplasm of breast: Secondary | ICD-10-CM

## 2011-07-29 LAB — CBC WITH DIFFERENTIAL/PLATELET
BASO%: 1.2 % (ref 0.0–2.0)
Basophils Absolute: 0 10*3/uL (ref 0.0–0.1)
EOS%: 4.1 % (ref 0.0–7.0)
Eosinophils Absolute: 0.1 10*3/uL (ref 0.0–0.5)
HCT: 36.3 % (ref 34.8–46.6)
HGB: 12 g/dL (ref 11.6–15.9)
LYMPH%: 36.6 % (ref 14.0–49.7)
MCH: 30.1 pg (ref 25.1–34.0)
MCHC: 33.2 g/dL (ref 31.5–36.0)
MCV: 90.8 fL (ref 79.5–101.0)
MONO#: 0.3 10*3/uL (ref 0.1–0.9)
MONO%: 8.8 % (ref 0.0–14.0)
NEUT#: 1.7 10*3/uL (ref 1.5–6.5)
NEUT%: 49.3 % (ref 38.4–76.8)
Platelets: 154 10*3/uL (ref 145–400)
RBC: 4 10*6/uL (ref 3.70–5.45)
RDW: 12.8 % (ref 11.2–14.5)
WBC: 3.4 10*3/uL — ABNORMAL LOW (ref 3.9–10.3)
lymph#: 1.2 10*3/uL (ref 0.9–3.3)
nRBC: 0 % (ref 0–0)

## 2011-07-29 MED ORDER — TAMOXIFEN CITRATE 20 MG PO TABS: 20.0000 mg | ORAL_TABLET | Freq: Every day | ORAL | Status: DC

## 2011-07-29 NOTE — Progress Notes (Signed)
Hematology and Oncology Follow Up Visit  Michele Cabrera [id number] [birth year] 56 y.o. 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  Interim History:  Patient is doing well. 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 She has had no intercurrent problems. She feels much better on the tamoxifen but finds that her bowels are little more sluggish.She is taking align which has helped. Her mother who is 82, has  astage 4 lung cancer with bony mets. She is living in [place] which is stressful. As mammogram was within normal limits A. bone density test done early 2012 shows mild osteopenia in the hip and normal bone density in the spine.  Medications: I have reviewed the patient's current medications.  Allergies:  Allergies  Allergen Reactions  . Penicillins Hives  . Sulfa Antibiotics Hives    Past Medical History, Surgical history, Social history, and Family History were reviewed and updated.  Review of Systems: Constitutional:  Negative for fever, chills, night sweats, anorexia, weight loss, pain. Cardiovascular: no chest pain or dyspnea on exertion Respiratory: no cough, shortness of breath, or wheezing Neurological: no TIA or stroke symptoms Dermatological: negative ENT: negative Skin Gastrointestinal: no abdominal pain, sluggish  bowel habits, or black or bloody stools Genito-Urinary: no dysuria, trouble voiding, or hematuria Hematological and Lymphatic: negative Breast: negative for breast lumps Musculoskeletal: negative Remaining ROS negative.  Physical Exam: Blood pressure 109/68, pulse 80, temperature 98.2 F (36.8 C), height 5' 8.5" (1.74 m), weight 136 lb 8 oz (61.916 kg). ECOG: 0 General appearance: alert,  cooperative and appears stated age Head: Normocephalic, without obvious abnormality, atraumatic Neck: no adenopathy, no carotid bruit, no JVD, supple, symmetrical, trachea midline and thyroid not enlarged, symmetric, no tenderness/mass/nodules Lymph nodes: Cervical, supraclavicular, and axillary nodes normal. Cardiac : Normal heart sounds  Pulmonary: Normal Breasts: Right and left breasts are free of any masses. The left breast is status post lumpectomy with a healed surgical scar and no evidence of local recurrence. Both axilla negative for adenopathy. Abdomen: Normal Extremities normal Neuro: Normal  Lab Results: Lab Results  Component Value Date   WBC 3.4* 07/29/2011   HGB 12.0 07/29/2011   HCT 36.3 07/29/2011   MCV 90.8 07/29/2011   PLT 154 07/29/2011     Chemistry      Component Value Date/Time   NA 141 01/02/2011 1426   NA 141 01/02/2011 1426   NA 141 01/02/2011 1426   K 4.1 01/02/2011 1426   K 4.1 01/02/2011 1426   K 4.1 01/02/2011 1426   CL 104 01/02/2011 1426   CL 104 01/02/2011 1426   CL 104 01/02/2011 1426   CO2 28 01/02/2011 1426   CO2 28 01/02/2011 1426   CO2 28 01/02/2011 1426   BUN 12 01/02/2011 1426   BUN 12 01/02/2011 1426   BUN 12 01/02/2011 1426   CREATININE 1.05 01/02/2011 1426   CREATININE 1.05 01/02/2011 1426   CREATININE 1.05 01/02/2011 1426      Component Value Date/Time   CALCIUM 9.7 01/02/2011 1426   CALCIUM 9.7 01/02/2011 1426   CALCIUM 9.7 01/02/2011 1426   ALKPHOS 55 01/02/2011 1426   ALKPHOS 55 01/02/2011 1426   ALKPHOS 55 01/02/2011 1426   AST 27 01/02/2011 1426   AST 27  01/02/2011 1426   AST 27 01/02/2011 1426   ALT 27 01/02/2011 1426   ALT 27 01/02/2011 1426   ALT 27 01/02/2011 1426   BILITOT 0.4 01/02/2011 1426   BILITOT 0.4 01/02/2011 1426   BILITOT 0.4 01/02/2011 1426       Radiological Studies: chest X-ray n/a Mammogram Last one 1/13- due 1/14 Bone density Due 1/14  Impression and Plan: +56 year old woman history of 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 There is no clinical evidence of recurrence I will see her in 6 months with appropriate imaging studies.  More than 50% of the visit was spent in patient-related counselling   Pierce Crane, MD 5/28/20139:29 AM

## 2011-07-30 LAB — CANCER ANTIGEN 27.29: CA 27.29: 14 U/mL (ref 0–39)

## 2011-07-30 LAB — COMPREHENSIVE METABOLIC PANEL
ALT: 24 U/L (ref 0–35)
AST: 24 U/L (ref 0–37)
Albumin: 4.1 g/dL (ref 3.5–5.2)
Alkaline Phosphatase: 53 U/L (ref 39–117)
BUN: 13 mg/dL (ref 6–23)
CO2: 28 mEq/L (ref 19–32)
Calcium: 8.8 mg/dL (ref 8.4–10.5)
Chloride: 107 mEq/L (ref 96–112)
Creatinine, Ser: 0.82 mg/dL (ref 0.50–1.10)
Glucose, Bld: 72 mg/dL (ref 70–99)
Potassium: 4 mEq/L (ref 3.5–5.3)
Sodium: 142 mEq/L (ref 135–145)
Total Bilirubin: 0.4 mg/dL (ref 0.3–1.2)
Total Protein: 6 g/dL (ref 6.0–8.3)

## 2011-07-30 LAB — VITAMIN D 25 HYDROXY (VIT D DEFICIENCY, FRACTURES): Vit D, 25-Hydroxy: 69 ng/mL (ref 30–89)

## 2011-07-31 ENCOUNTER — Telehealth: Payer: Self-pay | Admitting: *Deleted

## 2011-07-31 ENCOUNTER — Other Ambulatory Visit: Payer: Self-pay | Admitting: *Deleted

## 2011-07-31 DIAGNOSIS — C50919 Malignant neoplasm of unspecified site of unspecified female breast: Secondary | ICD-10-CM

## 2011-07-31 NOTE — Telephone Encounter (Signed)
made patient appointment for 03-11-2012 at the breast center for the mammogram left voice message to inform the patient of the new date and time of the mammogram

## 2011-10-07 ENCOUNTER — Other Ambulatory Visit: Payer: Self-pay | Admitting: Oncology

## 2011-10-07 DIAGNOSIS — C50919 Malignant neoplasm of unspecified site of unspecified female breast: Secondary | ICD-10-CM

## 2012-01-05 ENCOUNTER — Telehealth: Payer: Self-pay | Admitting: *Deleted

## 2012-01-05 NOTE — Telephone Encounter (Signed)
patient called in and requested to change her appointment to 02-26-2012 at 2:00pm lab one week before

## 2012-01-20 ENCOUNTER — Other Ambulatory Visit: Payer: BC Managed Care – PPO | Admitting: Lab

## 2012-01-27 ENCOUNTER — Ambulatory Visit: Payer: BC Managed Care – PPO | Admitting: Oncology

## 2012-02-19 ENCOUNTER — Other Ambulatory Visit (HOSPITAL_BASED_OUTPATIENT_CLINIC_OR_DEPARTMENT_OTHER): Payer: BC Managed Care – PPO | Admitting: Lab

## 2012-02-19 DIAGNOSIS — E559 Vitamin D deficiency, unspecified: Secondary | ICD-10-CM

## 2012-02-19 DIAGNOSIS — C50919 Malignant neoplasm of unspecified site of unspecified female breast: Secondary | ICD-10-CM

## 2012-02-19 DIAGNOSIS — C50519 Malignant neoplasm of lower-outer quadrant of unspecified female breast: Secondary | ICD-10-CM

## 2012-02-19 LAB — CBC WITH DIFFERENTIAL/PLATELET
BASO%: 0.8 % (ref 0.0–2.0)
Basophils Absolute: 0 10*3/uL (ref 0.0–0.1)
EOS%: 1.4 % (ref 0.0–7.0)
Eosinophils Absolute: 0.1 10*3/uL (ref 0.0–0.5)
HCT: 38.8 % (ref 34.8–46.6)
HGB: 13.1 g/dL (ref 11.6–15.9)
LYMPH%: 35 % (ref 14.0–49.7)
MCH: 30.7 pg (ref 25.1–34.0)
MCHC: 33.7 g/dL (ref 31.5–36.0)
MCV: 91.1 fL (ref 79.5–101.0)
MONO#: 0.4 10*3/uL (ref 0.1–0.9)
MONO%: 6.7 % (ref 0.0–14.0)
NEUT#: 3 10*3/uL (ref 1.5–6.5)
NEUT%: 56.1 % (ref 38.4–76.8)
Platelets: 146 10*3/uL (ref 145–400)
RBC: 4.26 10*6/uL (ref 3.70–5.45)
RDW: 13.2 % (ref 11.2–14.5)
WBC: 5.4 10*3/uL (ref 3.9–10.3)
lymph#: 1.9 10*3/uL (ref 0.9–3.3)

## 2012-02-19 LAB — COMPREHENSIVE METABOLIC PANEL (CC13)
ALT: 21 U/L (ref 0–55)
AST: 24 U/L (ref 5–34)
Albumin: 4 g/dL (ref 3.5–5.0)
Alkaline Phosphatase: 67 U/L (ref 40–150)
BUN: 16 mg/dL (ref 7.0–26.0)
CO2: 28 mEq/L (ref 22–29)
Calcium: 9.5 mg/dL (ref 8.4–10.4)
Chloride: 104 mEq/L (ref 98–107)
Creatinine: 0.9 mg/dL (ref 0.6–1.1)
Glucose: 97 mg/dl (ref 70–99)
Potassium: 3.8 mEq/L (ref 3.5–5.1)
Sodium: 140 mEq/L (ref 136–145)
Total Bilirubin: 0.36 mg/dL (ref 0.20–1.20)
Total Protein: 7.1 g/dL (ref 6.4–8.3)

## 2012-02-20 LAB — VITAMIN D 25 HYDROXY (VIT D DEFICIENCY, FRACTURES): Vit D, 25-Hydroxy: 58 ng/mL (ref 30–89)

## 2012-02-20 LAB — CANCER ANTIGEN 27.29: CA 27.29: 15 U/mL (ref 0–39)

## 2012-02-26 ENCOUNTER — Ambulatory Visit (HOSPITAL_BASED_OUTPATIENT_CLINIC_OR_DEPARTMENT_OTHER): Payer: BC Managed Care – PPO | Admitting: Oncology

## 2012-02-26 ENCOUNTER — Telehealth: Payer: Self-pay | Admitting: *Deleted

## 2012-02-26 VITALS — BP 125/76 | HR 74 | Temp 98.1°F | Resp 20 | Ht 68.5 in | Wt 134.2 lb

## 2012-02-26 DIAGNOSIS — C50519 Malignant neoplasm of lower-outer quadrant of unspecified female breast: Secondary | ICD-10-CM

## 2012-02-26 DIAGNOSIS — Z170 Estrogen receptor positive status [ER+]: Secondary | ICD-10-CM

## 2012-02-26 DIAGNOSIS — C50919 Malignant neoplasm of unspecified site of unspecified female breast: Secondary | ICD-10-CM

## 2012-02-26 NOTE — Progress Notes (Signed)
Hematology and Oncology Follow Up Visit  Michele Cabrera [id number] 1955-11-07 56 y.o. 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  Interim History:  Patient is doing well. 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 She has had no intercurrent problems. She feels much better on the tamoxifen but finds that her bowels are little more sluggish.She is taking align which has helped. Her mother who is 68, has  astage 4 lung cancer with bony mets. She is living in [place] which is stressful., so Michele Cabrera is coming up there every other week.  A mammogram will be done in 1/14.  A. bone density test will be done at the same time.  Medications: I have reviewed the patient's current medications.  Allergies:  Allergies  Allergen Reactions  . Penicillins Hives  . Sulfa Antibiotics Hives    Past Medical History, Surgical history, Social history, and Family History were reviewed and updated.  Review of Systems: Constitutional:  Negative for fever, chills, night sweats, anorexia, weight loss, pain. Cardiovascular: no chest pain or dyspnea on exertion Respiratory: no cough, shortness of breath, or wheezing Neurological: no TIA or stroke symptoms Dermatological: negative ENT: negative Skin Gastrointestinal: no abdominal pain, sluggish  bowel habits, or black or bloody stools Genito-Urinary: no dysuria, trouble voiding, or hematuria Hematological and Lymphatic: negative Breast: negative for breast lumps Musculoskeletal: negative Remaining ROS negative.  Physical Exam: Blood pressure 125/76, pulse 74, temperature 98.1 F (36.7 C), resp. rate 20, height 5' 8.5" (1.74 m), weight 134 lb 3.2 oz (60.873 kg). ECOG: 0 General appearance: alert,  cooperative and appears stated age Head: Normocephalic, without obvious abnormality, atraumatic Neck: no adenopathy, no carotid bruit, no JVD, supple, symmetrical, trachea midline and thyroid not enlarged, symmetric, no tenderness/mass/nodules Lymph nodes: Cervical, supraclavicular, and axillary nodes normal. Cardiac : Normal heart sounds  Pulmonary: Normal Breasts: Right and left breasts are free of any masses. The left breast is status post lumpectomy with a healed surgical scar and no evidence of local recurrence. Both axilla negative for adenopathy. Abdomen: Normal Extremities normal Neuro: Normal  Lab Results: Lab Results  Component Value Date   WBC 5.4 02/19/2012   HGB 13.1 02/19/2012   HCT 38.8 02/19/2012   MCV 91.1 02/19/2012   PLT 146 02/19/2012     Chemistry      Component Value Date/Time   NA 140 02/19/2012 1503   NA 142 07/29/2011 0814   K 3.8 02/19/2012 1503   K 4.0 07/29/2011 0814   CL 104 02/19/2012 1503   CL 107 07/29/2011 0814   CO2 28 02/19/2012 1503   CO2 28 07/29/2011 0814   BUN 16.0 02/19/2012 1503   BUN 13 07/29/2011 0814   CREATININE 0.9 02/19/2012 1503   CREATININE 0.82 07/29/2011 0814      Component Value Date/Time   CALCIUM 9.5 02/19/2012 1503   CALCIUM 8.8 07/29/2011 0814   ALKPHOS 67 02/19/2012 1503   ALKPHOS 53 07/29/2011 0814   AST 24 02/19/2012 1503   AST 24 07/29/2011 0814   ALT 21 02/19/2012 1503   ALT 24 07/29/2011 0814   BILITOT 0.36 02/19/2012 1503   BILITOT 0.4 07/29/2011 9629       Radiological Studies: chest X-ray n/a Mammogram Last one 1/13- due  1/14 Bone density Due 1/14  Impression and Plan : 9 -year-old woman 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 There is no clinical evidence of recurrence I will see her in 6 months with appropriate imaging studies. We discussed tamoxifen given the current data that suggests 10 years is intubated and 5. She has  occasionally tolerates as well and has no specific side effects. Followup will be in 6 months. More than 50% of the visit was spent in patient-related counselling   Pierce Crane, MD 12/26/20133:17 PM

## 2012-02-26 NOTE — Telephone Encounter (Signed)
Gave patient instruction on getting her sux month

## 2012-03-11 ENCOUNTER — Other Ambulatory Visit: Payer: BC Managed Care – PPO

## 2012-03-18 ENCOUNTER — Ambulatory Visit
Admission: RE | Admit: 2012-03-18 | Discharge: 2012-03-18 | Disposition: A | Discharge status: Home / Self Care | Payer: BC Managed Care – PPO | Source: Ambulatory Visit | Attending: Oncology | Admitting: Oncology

## 2012-03-18 ENCOUNTER — Other Ambulatory Visit: Payer: BC Managed Care – PPO

## 2012-03-18 DIAGNOSIS — C50919 Malignant neoplasm of unspecified site of unspecified female breast: Secondary | ICD-10-CM

## 2012-04-07 ENCOUNTER — Telehealth: Payer: Self-pay | Admitting: *Deleted

## 2012-04-07 NOTE — Telephone Encounter (Signed)
Pt called about her bone density scan.  Told her that I would have Keisha to all her.  Gave info to [place] to call.

## 2012-07-12 ENCOUNTER — Telehealth: Payer: Self-pay | Admitting: *Deleted

## 2012-07-12 NOTE — Telephone Encounter (Signed)
Pt called and left me a message about needing to get set up with a new provider. Called and left a message for pt to return my call so I can schedule her.

## 2012-07-12 NOTE — Telephone Encounter (Signed)
Pt called and left me a message stating that she would like to see Dr. Darnelle Catalan.  Called and left her a voicemail about who Dr. Darnelle Catalan was seeing Dr. Renelda Loma pts and that she would see Annice Pih first and then Dr. Darnelle Catalan and left date and time on voicemail and if she was not ok with this to please call me and I would reschedule her.

## 2012-07-14 ENCOUNTER — Telehealth: Payer: Self-pay | Admitting: *Deleted

## 2012-07-14 NOTE — Telephone Encounter (Signed)
Pt called and left me a voicemail stating that she is out of town and cannot make this appt.  She will call me back to reschedule.

## 2012-07-15 ENCOUNTER — Ambulatory Visit: Payer: BC Managed Care – PPO | Admitting: Family

## 2012-07-19 ENCOUNTER — Telehealth: Payer: Self-pay | Admitting: *Deleted

## 2012-07-19 NOTE — Telephone Encounter (Signed)
Pt called and confirmed 08/11/12 appt.

## 2012-07-19 NOTE — Telephone Encounter (Signed)
Pt called and left me a message about wanting to see Dr. Darnelle Catalan on either 6/6 or 6/11 before 2pm.  I called and left her a message stating that Dr. Darnelle Catalan is no in the office on 6/6, however we could get her in with Annice Pih and Dr. Darnelle Catalan on 6/11 at 1pm.  I have scheduled this appt and left a Message on pts voicemail letting her know this and if this is not ok to call me back and I would reschedule.

## 2012-08-11 ENCOUNTER — Ambulatory Visit (HOSPITAL_BASED_OUTPATIENT_CLINIC_OR_DEPARTMENT_OTHER): Payer: BC Managed Care – PPO | Admitting: Family

## 2012-08-11 ENCOUNTER — Encounter: Payer: Self-pay | Admitting: Family

## 2012-08-11 VITALS — BP 118/70 | HR 69 | Temp 98.7°F | Resp 20 | Ht 68.5 in | Wt 131.2 lb

## 2012-08-11 DIAGNOSIS — C50912 Malignant neoplasm of unspecified site of left female breast: Secondary | ICD-10-CM

## 2012-08-11 DIAGNOSIS — M899 Disorder of bone, unspecified: Secondary | ICD-10-CM

## 2012-08-11 DIAGNOSIS — C50919 Malignant neoplasm of unspecified site of unspecified female breast: Secondary | ICD-10-CM | POA: Insufficient documentation

## 2012-08-11 DIAGNOSIS — M949 Disorder of cartilage, unspecified: Secondary | ICD-10-CM

## 2012-08-11 DIAGNOSIS — Z853 Personal history of malignant neoplasm of breast: Secondary | ICD-10-CM

## 2012-08-11 NOTE — Patient Instructions (Signed)
Please contact us at [phone] if you have any questions or concerns.  Please continue to do well and enjoy life!!!  Get plenty of rest, drink plenty of water, exercise daily, eat a balanced diet.  Continue to take vitamin D3 daily.   Complete monthly self-breast examinations.  Have a clinical breast exam by a physician every year.  Have your mammogram completed every year.  Next mammogram due after 03/18/2013.

## 2012-08-11 NOTE — Progress Notes (Addendum)
[facility]  Telephone:([phone] Fax:([phone]) [phone]  OFFICE PROGRESS NOTE   ID: Michele Cabrera Cabrera   DOB: 01-Mar-1956  MR#: [id number]  JYN#:[id number]   PCP: Farris Has, MD ([facility]) GYN:  Leda Quail, MD SU: Harriette Bouillon, M.D RAD QMV:HQIO Michele Cabrera Cabrera, M.D.   HISTORY OF PRESENT ILLNESS: From Dr. Theron Arista Rubin's new patient evaluation note dated 05/26/2007: "This is a delightful 57 year old woman from [facility] referred by Dr. Luisa Hart for evaluation and treatment of breast cancer. This woman has been in excellent health.  She undergoes annual screening mammography.  A screening mammogram on 02/01/2007 showed no specific abnormalities.  She actually did detect something in her left breast.  The repeat mammogram on 03/17/2007 showed a suspicious 1 cm mass adjacent to the palpable finding.  Ultrasound at that location at 4 o'clock position 5 cm from the nipple showed a 1.2 x 1.2 x 0.7 cm mass, which was well circumscribed with microlobulated margins.  Biopsies were recommended.  Biopsy on 03/29/2007 showed invasive ductal cancer, focal DCIS, lymphovascular invasion was seen.  This appeared to have a low nuclear grade 1 of 3, was mildly differentiated.  Again there are features compatible with lymphovascular invasion.  This was ER and PR positive at 84% and 100% respectively, proliferative index was 15%, HER-2 was 1+.  Case number [id number].  A preoperative MRI scan was performed on 04/08/2007.  This showed a solitary enhancing mass at 4 o'clock position left breast.  No other abnormalities were seen.  No suspicious adenopathy was seen as well.  Michele Cabrera Cabrera underwent a lumpectomy and sentinel lymph node evaluation on 04/30/2007.  Case number [id number].  Final pathology showed invasive ductal cancer measuring 1.3 cm, grade 1.  Surgical margins were 0.1 cm superficial and 0.15 cm to the deep margin.  LVI was seen.  Associated DCIS was seen with some lobular neoplasia  (atypical lobular hyperplasia).  Additional tissue from the left lateral margin showed no other abnormalities.  Five sentinel lymph nodes were removed all of which were negative for malignancy.  Michele Cabrera Cabrera has had an unremarkable postoperative course."  Her subsequent history is as detailed below.  INTERVAL HISTORY: Dr. Darnelle Catalan and I saw Michele Cabrera Cabrera today for followup of invasive ductal carcinoma with DCIS of the left breast .  The patient was last seen by Dr. Donnie Coffin on 02/26/2012.  Since her last office visit, the patient has been doing relatively well.  She is establishing herself with Dr. Darrall Dears service today.  REVIEW OF SYSTEMS: A 10 point review of systems was completed and is negative except occasional constipation in which the patient is taking align and has increased her oral hydration.  The patient also has complaints of ongoing backaches and pains for which she is being seen by physical therapy and an orthopedic physician.  The patient denies any other symptomatology.   PAST MEDICAL HISTORY: Past Medical History  Diagnosis Date  . Breast cancer 03/2007    Left breast  . Bilateral bunions 1994    Left foot only  . ACL tear     Left ACL  . Tendon injury     Right wrist     PAST SURGICAL HISTORY: Past Surgical History  Procedure Laterality Date  . Breast lumpectomy with axillary lymph node biopsy Left 04/30/2007  . Arthroscopic repair acl Left 2001  . Bunionectomy Left 1994  . Tendon repair Right 1982  . Dilation and curettage of uterus  D & C x 2  . Hysterotomy  2009  Previous surgeries include 1982 right wrist tendon replacement.   FAMILY HISTORY Family History  Problem Relation Age of Onset  . Cancer Mother     Lung cancer and Breast cancer  . Cancer Father     Lung cancer  Father had lung cancer, died at age 26.  Mother is alive and well, diagnosed with breast cancer at age 80.  She has no brothers and sisters.  There is no other history of  breast or ovarian cancer in the family.   GYNECOLOGIC HISTORY: She is gravida 3, para 1, as an adult son age 3, menarche age 30.  Postmenopausal since 2006, and started using Prempro.  Prempro was discontinued at the time of diagnosis.  She subsequently developed hot flashes.  SOCIAL HISTORY: Michele Cabrera Cabrera and her husband Dr. Merton Cabrera and Michele Cabrera Cabrera have been married since 1980.  Her husband is a Press photographer in the Department of Merchandiser, retail and Family Studies at [employer].  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  She completed her masters degree at [employer] and her PhD at [employer].  She met her husband during graduate training.  They have one adult son who will be starting his Doctorate of Physical Therapy degree this fall.  In her spare time the patient likes to travel, go shopping, and participate in sports activities.   ADVANCED DIRECTIVES:  Not on file  HEALTH MAINTENANCE: History  Substance Use Topics  . Smoking status: Never Smoker   . Smokeless tobacco: Never Used  . Alcohol Use: Not on file    Colonoscopy: 01/2005 PAP: Not on file Bone density:  The patient's last bone density scan on 03/18/2012 showed a T score of -1.3 (osteopenia). Lipid panel: Not on file  Allergies  Allergen Reactions  . Penicillins Hives  . Sulfa Antibiotics Hives    Current Outpatient Prescriptions  Medication Sig Dispense Refill  . aspirin 81 MG tablet Take 81 mg by mouth daily.      . Grape Seed 100 MG CAPS Take 100 mg by mouth 1 day or 1 dose.      . Multiple Vitamins-Minerals (MULTIVITAMIN WITH MINERALS) tablet Take 1 tablet by mouth daily.      . Probiotic Product (ALIGN PO) Take 4 mg by mouth.      . Vitamin D, Ergocalciferol, (DRISDOL) 50000 UNITS CAPS Take 50,000 Units by mouth every 14 (fourteen) days.        No current facility-administered medications for this visit.    OBJECTIVE: Filed Vitals:   08/11/12 1259   BP: 118/70  Pulse: 69  Temp: 98.7 F (37.1 C)  Resp: 20     Body mass index is 19.66 kg/(m^2).      ECOG FS: 1 - Symptomatic but completely ambulatory  General appearance: Alert, cooperative, thin frame, no apparent distress Head: Normocephalic, without obvious abnormality, atraumatic Eyes: Mild early cataract formation (L >R), PERRLA, EOMI Nose: Nares, septum and mucosa are normal, no drainage or sinus tenderness Neck: No adenopathy, supple, symmetrical, trachea midline, thyroid not enlarged, no tenderness Resp: Clear to auscultation bilaterally Cardio: Regular rate and rhythm, S1, S2 normal, no murmur, click, rub or gallop Breasts: Left breast has well-healed surgical scar, no lymphadenopathy, retracted nipples bilaterally, no axilla fullness GI: Soft, not distended, non-tender, hypoactive bowel sounds, no organomegaly Extremities: Extremities normal, atraumatic, no cyanosis or edema Lymph nodes: Cervical, supraclavicular, and axillary nodes  normal Neurologic: Grossly normal   LAB RESULTS: Lab Results  Component Value Date   WBC 5.4 02/19/2012   NEUTROABS 3.0 02/19/2012   HGB 13.1 02/19/2012   HCT 38.8 02/19/2012   MCV 91.1 02/19/2012   PLT 146 02/19/2012      Chemistry      Component Value Date/Time   NA 140 02/19/2012 1503   NA 142 07/29/2011 0814   K 3.8 02/19/2012 1503   K 4.0 07/29/2011 0814   CL 104 02/19/2012 1503   CL 107 07/29/2011 0814   CO2 28 02/19/2012 1503   CO2 28 07/29/2011 0814   BUN 16.0 02/19/2012 1503   BUN 13 07/29/2011 0814   CREATININE 0.9 02/19/2012 1503   CREATININE 0.82 07/29/2011 0814      Component Value Date/Time   CALCIUM 9.5 02/19/2012 1503   CALCIUM 8.8 07/29/2011 0814   ALKPHOS 67 02/19/2012 1503   ALKPHOS 53 07/29/2011 0814   AST 24 02/19/2012 1503   AST 24 07/29/2011 0814   ALT 21 02/19/2012 1503   ALT 24 07/29/2011 0814   BILITOT 0.36 02/19/2012 1503   BILITOT 0.4 07/29/2011 0814      Lab Results  Component Value Date    LABCA2 15 02/19/2012    Urinalysis    Component Value Date/Time   COLORURINE YELLOW 03/26/2009 0615   APPEARANCEUR CLEAR 03/26/2009 0615   LABSPEC 1.020 03/26/2009 0615   PHURINE 5.5 03/26/2009 0615   GLUCOSEU NEGATIVE 03/26/2009 0615   HGBUR NEGATIVE 03/26/2009 0615   BILIRUBINUR NEGATIVE 03/26/2009 0615   KETONESUR NEGATIVE 03/26/2009 0615   PROTEINUR NEGATIVE 03/26/2009 0615   UROBILINOGEN 0.2 03/26/2009 0615   NITRITE NEGATIVE 03/26/2009 0615   LEUKOCYTESUR TRACE* 03/26/2009 0615    STUDIES: Dg Bone Density  03/18/2012   *RADIOLOGY REPORT*  Clinical Data: 57 year old Caucasian female with breast cancer diagnosed in 2009 for which the patient is currently undergoing Tamoxifen therapy.  Followup osteopenia identified on prior BMD testing.  DUAL X-RAY ABSORPTIOMETRY (DXA) FOR BONE MINERAL DENSITY  AP LUMBAR SPINE (L1-L4)  Bone Mineral Density (BMD):  0.999 g/cm2 Young Adult T Score:  -0.4 Z Score:   0.9  LEFT FEMUR NECK  Bone Mineral Density (BMD):   0.700 g/cm2 Young Adult T Score:     -1.3   Z Score:   -0.1  ASSESSMENT:  Patient's diagnostic category is LOW BONE MASS (OSTEOPENIA) by WHO Criteria.  FRACTURE RISK: INCREASED.  FRAX: Based on the World Health Organization FRAX model, the 10 year probability of a major osteoporotic fracture is 6.3%.  The 10 year probability of a hip fracture is 0.5%.  Since the initial examination in 2012, there has been no statistically significant change in bone mineral density in the lumbar spine and a statistically significant 4.4% increase in bone mineral density in the left femur.  Comparison: 03/06/2010.  RECOMMENDATIONS:  Effective therapies are available in the form of bisphosphonates, selective estrogen receptor modulators, biologic agents, and hormone replacement therapy (for women).  All patients should ensure an adequate intake of dietary calcium (1200mg  daily) and vitamin D (800 IU daily) unless contraindicated.  All treatment decisions require clinical  judgement and consideration of individual patient factors, including patient preferences, co-morbidities, previous drug use, risk factors not captured in the FRAX model (e.g., frailty, falls, vitamin D deficiency, increased bone turnover, interval significant decline in bone density) and possible under-or over-estimation of fracture risk by FRAX.  The National Osteoporosis Foundation recommends that FDA-approved medical therapies be considered  in postmenopausal women and mean age 42 or older with a:        1)     Hip or vertebral (clinical or morphometric) fracture.           2)    T-score of -2.5 or lower at the spine or hip. 3)    Ten-year fracture probability by FRAX of 3% or greater for hip fracture or 20% or greater for major osteoporotic fracture. FOLLOW-UP:  People with diagnosed cases of osteoporosis or at high risk for fracture should have regular bone mineral density tests.  For patients eligible for Medicare, routine testing is allowed once every 2 years.  The testing frequency can be increased to one year for patients who have rapidly progressing disease, those who are receiving or discontinuing medical therapy to restore bone mass, or have additional risk factors.  World Science writer [facility]) Criteria:  Normal: T scores from +1.0 to -1.0 Low Bone Mass (Osteopenia): T scores between -1.0 and -2.5 Osteoporosis: T scores -2.5 and below  Comparison to Reference Population:  T score is the key measure used in the diagnosis of osteoporosis and relative risk determination for fracture.  It provides a value for bone mass relative to the mean bone mass of a young adult reference population expressed in terms of standard deviation (SD).  Z score is the age-matched score showing the patient's values compared to a population matched for age, sex, and race.  This is also expressed in terms of standard deviation.  The patient may have values that compare favorably to the age-matched values and still be at  increased risk for fracture.   Original Report Authenticated By: Hulan Saas, M.D.   Mm Digital Diagnostic Bilat 03/18/2012   *RADIOLOGY REPORT*  Clinical Data:  57 year old female for annual bilateral mammograms - history of left breast cancer and left lumpectomy in 2009.  DIGITAL DIAGNOSTIC BILATERAL MAMMOGRAM WITH CAD  Comparison: 03/10/2011 and prior mammograms dating back to 03/29/2007.  Findings:  ACR Breast Density Category 2: There is a scattered fibroglandular pattern.  Routine views of both breasts and a magnification view of the lumpectomy site again demonstrate left breast scarring. There is no evidence of worrisome mass, nonsurgical distortion or suspicious calcifications bilaterally.  Mammographic images were processed with CAD.  IMPRESSION: No mammographic evidence of breast malignancy.  Left breast scarring.  BI-RADS CATEGORY 2:  Benign finding(s).  RECOMMENDATION: Bilateral diagnostic mammograms in 1 year  I discussed the findings and recommendations with the patient and her questions answered.  She was encouraged to begin/continue monthly self exams and to contact her primary physician if any changes noted. A written report was given to the patient.   Original Report Authenticated By: Harmon Pier, M.D.    ASSESSMENT: 57 y.[place]. [place], [place]:  1.  Status post left breast needle core biopsy at the 4 o'clock position on 03/29/2007 which showed invasive ductal carcinoma with focal ductal carcinoma in situ intermediate grade with necrosis and calcifications, lymphovascular space invasion also present, estrogen receptor 84% positive, progesterone receptor 100% positive, Ki-67 15%, HER-2/neu negative.  2.  Status post bilateral breast MRI on 04/08/2007 which showed a solitary oval enhancing mass with irregular margins noted in the left breast at the 4 o'clock position measuring 1.4 x 1.1 x 0.8 cm.  No other area of abnormal enhancement was seen in either breast.  No  lymphadenopathy.  Scattered T2-weighted hyperintensive subcentimeter cyst were noted bilaterally.  3.  Status post left breast lumpectomy  with left axillary sentinel node biopsy and additional lateral margin excision on 04/30/2007 for a stage I, pT1c pN0, 1.3 cm invasive ductal carcinoma with calcifications grade 1, ductal carcinoma in situ with calcifications low-grade, atypical lobular hyperplasia, estrogen receptor positive 84%, progesterone receptor positive 100%, Ki-67 15%, HER-2/neu by FISH negative, with 0/5 metastatic left axillary lymph nodes.  4.  Oncotype DX report dated 06/04/2007 showed a recurrent score of 12 with an average rate of distant recurrence at 8% .  5.  The patient had radiation treatment from 07/24/2007 to 08/12/2007.  6.  The patient started antiestrogen therapy with Femara in 08/2007 and continued using the Femara until 07/2010 when it was discontinued due to intolerance (joint aches/pains).  The patient then started antiestrogen therapy with tamoxifen in 07/2010 and will continue until end of 08/2012.  At that time she will have completed 5 years of antiestrogen therapy.  7.  The patient's last bone density scan on 03/18/2012 showed a T score of -1.3 (osteopenia).  8.  The patient's last bilateral digital diagnostic mammogram on 03/18/2012 showed no mammographic evidence of breast malignancy.  Benign findings.    9.  The patient  had genetic testing and was found to be negative for the BRCA1 and BRCA2 gene mutations  PLAN: The patient is over 5 years from her time of diagnosis and will officially become a graduate of [employer]'s breast cancer program today. We asked that she continue annual clinical breast examinations by a physician in addition to annual mammography.  Her last mammogram and bone density report results are listed above.  She is due for her annual mammogram after 03/18/2013.    All questions were answered.  The patient was encouraged to contact us with any  problems, questions or concerns.   Larina Bras, NP-C 08/11/2012, 2:43 PM   ADDENDUM: This 57 year old [place] woman established herself in my practice today. She underwent a left lumpectomy and sentinel lymph node sampling February of 2009 for a pT1c pN0, stage IA invasive ductal carcinoma, grade 1, which was strongly estrogen and progesterone receptor positive, with a low proliferation fraction, and no HER-2 amplification. Her Oncotype DX score predicted a distant risk of recurrence within 10 years of 8% if her only adjuvant therapy was tamoxifen for 5 years. In fact, after completing radiation in June of 2009, she received 2 years of letrozole and 3 years of tamoxifen, currently being completed.  We discussed the fact that we do not have any better treatment in terms of adjuvant therapy then to once she has received. Her risk of recurrence will be considerably less than 8% firstly because she also took aromatase inhibitors and secondly because 5 years have passed and there is no evidence of disease recurrence.  Accordingly I am comfortable releasing her to her primary care physician at this time. I have given the patient a written account of her diagnosis, treatment, and prognosis and I am writing her physician a letter regarding followup. We are making no further appointments for Michele Cabrera Cabrera here, but she knows we will be glad to see her again in the future if the need arises.  I personally saw this patient and performed a substantive portion of this encounter with the listed APP documented above.   Lowella Dell, MD

## 2012-08-12 ENCOUNTER — Telehealth: Payer: Self-pay | Admitting: Oncology

## 2012-08-12 NOTE — Telephone Encounter (Signed)
Fax letter to [employer] office from Dr. Darnelle Catalan

## 2012-12-22 ENCOUNTER — Encounter: Payer: Self-pay | Admitting: Obstetrics & Gynecology

## 2012-12-23 ENCOUNTER — Telehealth: Payer: Self-pay | Admitting: Obstetrics & Gynecology

## 2012-12-23 ENCOUNTER — Ambulatory Visit: Payer: Self-pay | Admitting: Obstetrics & Gynecology

## 2012-12-23 DIAGNOSIS — Z01419 Encounter for gynecological examination (general) (routine) without abnormal findings: Secondary | ICD-10-CM

## 2012-12-23 NOTE — Telephone Encounter (Signed)
Called patient and left message re: dnka AEX today. Requested patient call back and reschedule. Patient in recall.

## 2012-12-27 ENCOUNTER — Telehealth: Payer: Self-pay | Admitting: Obstetrics & Gynecology

## 2012-12-27 NOTE — Telephone Encounter (Signed)
Patient called during lunch but didn't say what for said she would try back after 3:00

## 2012-12-27 NOTE — Telephone Encounter (Signed)
Patient was calling to re-schedule AEX, feels that she legitimately just misread appointment time/date. She is sorry that she missed appointment. Rescheduled annual exam.   Routing to provider for final review. Patient agreeable to disposition. Will close encounter

## 2012-12-27 NOTE — Telephone Encounter (Signed)
Message left to return call to Sonna Lipsky at [phone].    

## 2012-12-28 NOTE — Telephone Encounter (Signed)
Please remove No show fee.  Thanks.

## 2012-12-28 NOTE — Telephone Encounter (Signed)
Hi Jessica, Please waive the dnka fee for this patient. Thanks, Mervin Kung

## 2012-12-28 NOTE — Telephone Encounter (Signed)
DONE.   Michele Cabrera

## 2013-01-06 ENCOUNTER — Encounter: Payer: Self-pay | Admitting: Obstetrics & Gynecology

## 2013-01-06 ENCOUNTER — Ambulatory Visit (INDEPENDENT_AMBULATORY_CARE_PROVIDER_SITE_OTHER): Payer: BC Managed Care – PPO | Admitting: Obstetrics & Gynecology

## 2013-01-06 VITALS — BP 116/58 | HR 64 | Resp 16 | Ht 68.25 in | Wt 131.4 lb

## 2013-01-06 DIAGNOSIS — Z0000 Encounter for general adult medical examination without abnormal findings: Secondary | ICD-10-CM

## 2013-01-06 DIAGNOSIS — Z01419 Encounter for gynecological examination (general) (routine) without abnormal findings: Secondary | ICD-10-CM

## 2013-01-06 LAB — POCT URINALYSIS DIPSTICK
Bilirubin, UA: NEGATIVE
Blood, UA: NEGATIVE
Glucose, UA: NEGATIVE
Ketones, UA: NEGATIVE
Leukocytes, UA: NEGATIVE
Nitrite, UA: NEGATIVE
Protein, UA: NEGATIVE
Urobilinogen, UA: NEGATIVE
pH, UA: 7

## 2013-01-06 LAB — HEMOGLOBIN, FINGERSTICK: Hemoglobin, fingerstick: 12.9 g/dL (ref 12.0–16.0)

## 2013-01-06 MED ORDER — VITAMIN D (ERGOCALCIFEROL) 1.25 MG (50000 UNIT) PO CAPS: 50000.0000 [IU] | ORAL_CAPSULE | ORAL | Status: DC

## 2013-01-06 NOTE — Patient Instructions (Signed)

## 2013-01-06 NOTE — Progress Notes (Signed)
57 y.o. G3P1 MarriedCaucasianF here for annual exam.  Released from oncology in June.  No vaginal bleeding.  Doing quite well.  Busy at work.  Recent back issues.  Imaging showed bulging disc.  Went to PT.  Exercises help.  Patient's last menstrual period was 10/01/2008.          Sexually active: yes  The current method of family planning is tubal ligation.    Exercising: yes  gym and cross training 1 x weekly Smoker:  no  Health Maintenance: Pap:  11/20/11 WNL/negative HR HPV History of abnormal Pap:  yes MMG:  03/18/12 diag in one year Colonoscopy:  2007 repeat in 10 years BMD:   1/12, -0.5, -1.6 TDaP:  09/17/09 Screening Labs: with oncology and lipids 2013 here, Hb today: 12.9, Urine today: PH 7.0   reports that she has never smoked. She has never used smokeless tobacco. She reports that she drinks about 2.4 ounces of alcohol per week. She reports that she does not use illicit drugs.  Past Medical History  Diagnosis Date  . 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 Bilateral bunions 1994    Left foot only  . ACL tear     Left ACL  . Tendon injury     Right wrist  . STD (sexually transmitted disease) 2000    exposure/ Chlamydia  . Benign thyroid cyst     on top of thyroid    Past Surgical History  Procedure Laterality Date  . Breast lumpectomy with axillary lymph node biopsy Left 04/30/2007  . Arthroscopic repair acl Left 2001  . Bunionectomy Left 1994  . Tendon repair Right 1982  . Hysterotomy  2009  . Tubal ligation  1992  . Dilation and curettage of uterus  1987    D & C x 2  . Wrist surgery  1982  . Hysteroscopic fibroid resection      Current Outpatient Prescriptions  Medication Sig Dispense Refill  . aspirin 81 MG tablet Take 81 mg by mouth daily.      . Grape Seed 100 MG CAPS Take 100 mg by mouth 1 day or 1 dose.      . Multiple Vitamins-Minerals (MULTIVITAMIN WITH MINERALS) tablet Take 1 tablet by mouth daily.      . Vitamin D,  Ergocalciferol, (DRISDOL) 50000 UNITS CAPS Take 50,000 Units by mouth every 14 (fourteen) days.        No current facility-administered medications for this visit.    Family History  Problem Relation Age of Onset  . Cancer Mother     Lung cancer and Breast cancer  . Cancer Father     Lung cancer  . Hypertension Maternal Grandmother   . Heart attack Maternal Grandmother   . Stroke Maternal Grandfather   . Skin cancer Paternal Grandmother   . Lung cancer Other     2 paternal great uncles  . Hypertension Maternal Aunt     x2  . Heart attack Maternal Grandmother   . Stroke Maternal Grandfather   . Heart attack Maternal Uncle     x2  . Emphysema Father   . Ulcers Mother   . Kidney Stones Father     ROS:  Pertinent items are noted in HPI.  Otherwise, a comprehensive ROS was negative.  Exam:   BP 116/58  Pulse 64  Resp 16  Ht 5' 8.25" (1.734 m)  Wt 131 lb 6.4 oz (59.603 kg)  BMI  19.82 kg/m2  LMP 10/01/2008  Weight change: stable  Height: 5' 8.25" (173.4 cm)  Ht Readings from Last 3 Encounters:  01/06/13 5' 8.25" (1.734 m)  08/11/12 5' 8.5" (1.74 m)  02/26/12 5' 8.5" (1.74 m)    General appearance: alert, cooperative and appears stated age Head: Normocephalic, without obvious abnormality, atraumatic Neck: no adenopathy, supple, symmetrical, trachea midline and thyroid normal to inspection and palpation Lungs: clear to auscultation bilaterally Breasts: normal appearance, no masses or tenderness, well healed thicked left scarring Heart: regular rate and rhythm Abdomen: soft, non-tender; bowel sounds normal; no masses,  no organomegaly Extremities: extremities normal, atraumatic, no cyanosis or edema Skin: Skin color, texture, turgor normal. No rashes or lesions Lymph nodes: Cervical, supraclavicular, and axillary nodes normal. No abnormal inguinal nodes palpated Neurologic: Grossly normal   Pelvic: External genitalia:  no lesions              Urethra:  normal  appearing urethra with no masses, tenderness or lesions              Bartholins and Skenes: normal                 Vagina: normal appearing vagina with normal color and discharge, no lesions              Cervix: no lesions              Pap taken: no Bimanual Exam:  Uterus:  normal size, contour, position, consistency, mobility, non-tender              Adnexa: normal adnexa and no mass, fullness, tenderness               Rectovaginal: Confirms               Anus:  normal sphincter tone, no lesions  A:  Well Woman with normal exam PMP, no HRT Recent back issues--going to PT H/O breast cancer 1/09, released from oncology 6/14 Osteopenia Low Vit D, last checked 12/13-58.  On supplementation. S/p hysteroscopic fibroid resection 1/11  P:   Mammogram yearly pap smear with neg HR HPV 9/13 No labs today Vit D to pharmacy return annually or prn  An After Visit Summary was printed and given to the patient.

## 2013-03-14 ENCOUNTER — Other Ambulatory Visit: Payer: Self-pay

## 2013-03-14 DIAGNOSIS — Z9889 Other specified postprocedural states: Secondary | ICD-10-CM

## 2013-03-14 DIAGNOSIS — Z1231 Encounter for screening mammogram for malignant neoplasm of breast: Secondary | ICD-10-CM

## 2013-03-14 DIAGNOSIS — Z853 Personal history of malignant neoplasm of breast: Secondary | ICD-10-CM

## 2013-04-01 ENCOUNTER — Other Ambulatory Visit: Payer: Self-pay | Admitting: Obstetrics & Gynecology

## 2013-04-01 ENCOUNTER — Ambulatory Visit
Admission: RE | Admit: 2013-04-01 | Discharge: 2013-04-01 | Disposition: A | Discharge status: Home / Self Care | Payer: BC Managed Care – PPO | Source: Ambulatory Visit

## 2013-04-01 DIAGNOSIS — Z9889 Other specified postprocedural states: Secondary | ICD-10-CM

## 2013-04-01 DIAGNOSIS — Z853 Personal history of malignant neoplasm of breast: Secondary | ICD-10-CM

## 2013-04-01 DIAGNOSIS — Z1231 Encounter for screening mammogram for malignant neoplasm of breast: Secondary | ICD-10-CM

## 2013-04-08 ENCOUNTER — Ambulatory Visit: Admission: RE | Admit: 2013-04-08 | Payer: BC Managed Care – PPO | Source: Ambulatory Visit

## 2013-04-08 ENCOUNTER — Ambulatory Visit
Admission: RE | Admit: 2013-04-08 | Discharge: 2013-04-08 | Disposition: A | Discharge status: Home / Self Care | Payer: BC Managed Care – PPO | Source: Ambulatory Visit | Attending: Obstetrics & Gynecology | Admitting: Obstetrics & Gynecology

## 2013-04-08 ENCOUNTER — Other Ambulatory Visit: Payer: Self-pay | Admitting: Obstetrics & Gynecology

## 2013-04-08 DIAGNOSIS — Z9889 Other specified postprocedural states: Secondary | ICD-10-CM

## 2013-04-08 DIAGNOSIS — Z853 Personal history of malignant neoplasm of breast: Secondary | ICD-10-CM

## 2013-09-29 ENCOUNTER — Telehealth: Payer: Self-pay | Admitting: Obstetrics & Gynecology

## 2013-09-29 NOTE — Telephone Encounter (Signed)
Pt wants to know when the last time she had a colonoscopy.

## 2013-09-30 NOTE — Telephone Encounter (Signed)
Spoke with patient. Advised patient per our records last colonoscopy was done in 2007. Patient would like to know if we have where it was done. Advised patient that I do not see records in [facility] with this information but that I would pull her chart to see if we have this on file. Patient is agreeable. Advised I would call back with further information.

## 2013-10-03 NOTE — Telephone Encounter (Addendum)
Left message to call Morris at [phone].   According to paper chart. Patient was seen with Dr.Mann in 2007 for colonoscopy.

## 2013-10-05 NOTE — Telephone Encounter (Signed)
Patient returning Kaitlyn's call. °

## 2013-10-05 NOTE — Telephone Encounter (Signed)
Spoke with patient. Advised last colonoscopy in 2007 was done with Dr.Mann. Patient states "So if I track her down I will be able to get those." Advised patient that Dr.Mann is at [facility] [phone]. Patient is agreeable and verbalizes understanding.  Routing to provider for final review. Patient agreeable to disposition. Will close encounter

## 2013-12-05 ENCOUNTER — Telehealth: Payer: Self-pay | Admitting: Obstetrics & Gynecology

## 2013-12-05 NOTE — Telephone Encounter (Signed)
Left message for patient to call back about an appointment

## 2014-01-02 ENCOUNTER — Encounter: Payer: Self-pay | Admitting: Obstetrics & Gynecology

## 2014-02-07 ENCOUNTER — Encounter: Payer: Self-pay | Admitting: Obstetrics & Gynecology

## 2014-02-07 ENCOUNTER — Ambulatory Visit (INDEPENDENT_AMBULATORY_CARE_PROVIDER_SITE_OTHER): Payer: BC Managed Care – PPO | Admitting: Obstetrics & Gynecology

## 2014-02-07 VITALS — BP 122/64 | HR 64 | Resp 16 | Ht 68.0 in | Wt 134.2 lb

## 2014-02-07 DIAGNOSIS — Z0000 Encounter for general adult medical examination without abnormal findings: Secondary | ICD-10-CM

## 2014-02-07 DIAGNOSIS — Z01419 Encounter for gynecological examination (general) (routine) without abnormal findings: Secondary | ICD-10-CM

## 2014-02-07 DIAGNOSIS — Z124 Encounter for screening for malignant neoplasm of cervix: Secondary | ICD-10-CM

## 2014-02-07 LAB — POCT URINALYSIS DIPSTICK
Bilirubin, UA: NEGATIVE
Blood, UA: NEGATIVE
Glucose, UA: NEGATIVE
Ketones, UA: NEGATIVE
Leukocytes, UA: NEGATIVE
Nitrite, UA: NEGATIVE
Protein, UA: NEGATIVE
Urobilinogen, UA: NEGATIVE
pH, UA: 5

## 2014-02-07 MED ORDER — VITAMIN D (ERGOCALCIFEROL) 1.25 MG (50000 UNIT) PO CAPS: 50000.0000 [IU] | ORAL_CAPSULE | ORAL | Status: DC

## 2014-02-07 NOTE — Progress Notes (Signed)
58 y.o. G36P1 MarriedCaucasianF here for annual exam.  No vaginal bleeding in several years.  Doing really well.  Pt reports biggest issue is with DDD.  Sees MDs at [facility].  Also, having a little more constipation that she feels is stress related.  Activia helps quite a bit.    PCP:  Dr. Orland Mustard.  Had blood work this summer.  Patient's last menstrual period was 10/01/2008.          Sexually active: Yes.    The current method of family planning is tubal ligation.    Exercising: Yes.    1 x weekly gym and weight bearing Smoker:  no  Health Maintenance: Pap:  11/20/11 WNL/negative HR HPV History of abnormal Pap:  Yes, 2003 endometrial cells/benign biopsy MMG:  04/29/13-normal diag in one year, h/o breast cancer Colonoscopy:  2007-repeat in 10 years Dr Collene Mares BMD:   03/18/12 TDaP:  09/17/09 Screening Labs: PCP, Hb today: PCP, Urine today: negative    reports that she has never smoked. She has never used smokeless tobacco. She reports that she drinks about 3.0 oz of alcohol per week. She reports that she does not use illicit drugs.  Past Medical History  Diagnosis Date  . 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 Bilateral bunions 1994    Left foot only  . ACL tear     Left ACL  . Tendon injury     Right wrist  . STD (sexually transmitted disease) 2000    exposure/ Chlamydia  . Benign thyroid cyst     on top of thyroid  . Osteopenia     Past Surgical History  Procedure Laterality Date  . Breast lumpectomy with axillary lymph node biopsy Left 04/30/2007  . Arthroscopic repair acl Left 2001  . Bunionectomy Left 1994  . Tendon repair Right 1982  . Hysterotomy  2009  . Tubal ligation  1992  . Dilation and curettage of uterus  1987    D & C x 2  . Wrist surgery  1982  . Hysteroscopic fibroid resection      Current Outpatient Prescriptions  Medication Sig Dispense Refill  . AFLURIA PRESERVATIVE FREE 0.5 ML SUSY   0  . aspirin 81 MG tablet Take 81 mg  by mouth daily.    . clindamycin (CLEOCIN T) 1 % external solution   0  . Grape Seed 100 MG CAPS Take 100 mg by mouth 1 day or 1 dose.    . Multiple Vitamins-Minerals (MULTIVITAMIN WITH MINERALS) tablet Take 1 tablet by mouth daily.    . Vitamin D, Ergocalciferol, (DRISDOL) 50000 UNITS CAPS capsule Take 1 capsule (50,000 Units total) by mouth every 14 (fourteen) days. 6 capsule 4   No current facility-administered medications for this visit.    Family History  Problem Relation Age of Onset  . Cancer Mother     Lung cancer and Breast cancer  . Cancer Father     Lung cancer  . Hypertension Maternal Grandmother   . Heart attack Maternal Grandmother   . Stroke Maternal Grandfather   . Skin cancer Paternal Grandmother   . Lung cancer Other     2 paternal great uncles  . Hypertension Maternal Aunt     x2  . Heart attack Maternal Grandmother   . Stroke Maternal Grandfather   . Heart attack Maternal Uncle     x2  . Emphysema Father   . Ulcers Mother   .  Kidney Stones Father     ROS:  Pertinent items are noted in HPI.  Otherwise, a comprehensive ROS was negative.  Exam:   BP 122/64 mmHg  Pulse 64  Resp 16  Ht 5\' 8"  (1.727 m)  Wt 134 lb 3.2 oz (60.873 kg)  BMI 20.41 kg/m2  LMP 10/01/2008  Weight change: +3#   Height: 5\' 8"  (172.7 cm)  Ht Readings from Last 3 Encounters:  02/07/14 5\' 8"  (1.727 m)  01/06/13 5' 8.25" (1.734 m)  08/11/12 5' 8.5" (1.74 m)    General appearance: alert, cooperative and appears stated age Head: Normocephalic, without obvious abnormality, atraumatic Neck: no adenopathy, supple, symmetrical, trachea midline and thyroid normal to inspection and palpation Lungs: clear to auscultation bilaterally Breasts: normal right bresat, left breast with scar RLQ with radiation changes at superior edge, stable from last year Heart: regular rate and rhythm Abdomen: soft, non-tender; bowel sounds normal; no masses,  no organomegaly Extremities: extremities normal,  atraumatic, no cyanosis or edema Skin: Skin color, texture, turgor normal. No rashes or lesions Lymph nodes: Cervical, supraclavicular, and axillary nodes normal. No abnormal inguinal nodes palpated Neurologic: Grossly normal   Pelvic: External genitalia:  no lesions              Urethra:  normal appearing urethra with no masses, tenderness or lesions              Bartholins and Skenes: normal                 Vagina: normal appearing vagina with normal color and discharge, no lesions              Cervix: no lesions              Pap taken: Yes.   Bimanual Exam:  Uterus:  normal size, contour, position, consistency, mobility, non-tender              Adnexa: normal adnexa and no mass, fullness, tenderness               Rectovaginal: Confirms               Anus:  normal sphincter tone, no lesions  A:  Well Woman with normal exam PMP, no HRT Recent back issues--going to PT H/O breast cancer 1/09, released from oncology 6/14 Osteopenia Low Vit D, last checked with PCP.  On every other week dosing.  Does not want to change to daily OTC supplementation. S/p hysteroscopic fibroid resection 1/11.  No VB since.  P: Mammogram yearly pap smear with neg HR HPV 9/13.  Pap today. Labs with PCP Vit D 50K every 14 days.  #6/4RF.   Return annually or prn  An After Visit Summary was printed and given to the patient.

## 2014-02-09 LAB — IPS PAP TEST WITH REFLEX TO HPV

## 2014-03-03 DIAGNOSIS — H409 Unspecified glaucoma: Secondary | ICD-10-CM

## 2014-03-03 HISTORY — DX: Unspecified glaucoma: H40.9

## 2014-03-08 ENCOUNTER — Other Ambulatory Visit: Payer: Self-pay | Admitting: Obstetrics & Gynecology

## 2014-03-08 DIAGNOSIS — Z853 Personal history of malignant neoplasm of breast: Secondary | ICD-10-CM

## 2014-04-10 ENCOUNTER — Other Ambulatory Visit: Payer: Self-pay | Admitting: Obstetrics & Gynecology

## 2014-04-10 ENCOUNTER — Ambulatory Visit
Admission: RE | Admit: 2014-04-10 | Discharge: 2014-04-10 | Disposition: A | Discharge status: Home / Self Care | Payer: BC Managed Care – PPO | Source: Ambulatory Visit | Attending: Obstetrics & Gynecology | Admitting: Obstetrics & Gynecology

## 2014-04-10 DIAGNOSIS — Z853 Personal history of malignant neoplasm of breast: Secondary | ICD-10-CM

## 2014-04-10 DIAGNOSIS — Z1231 Encounter for screening mammogram for malignant neoplasm of breast: Secondary | ICD-10-CM

## 2014-05-22 ENCOUNTER — Encounter (INDEPENDENT_AMBULATORY_CARE_PROVIDER_SITE_OTHER): Payer: BC Managed Care – PPO | Admitting: Ophthalmology

## 2014-05-22 DIAGNOSIS — H33302 Unspecified retinal break, left eye: Secondary | ICD-10-CM | POA: Diagnosis not present

## 2014-05-22 DIAGNOSIS — H43813 Vitreous degeneration, bilateral: Secondary | ICD-10-CM | POA: Diagnosis not present

## 2014-05-22 DIAGNOSIS — H2513 Age-related nuclear cataract, bilateral: Secondary | ICD-10-CM

## 2014-05-22 DIAGNOSIS — H35413 Lattice degeneration of retina, bilateral: Secondary | ICD-10-CM

## 2014-05-22 DIAGNOSIS — H3562 Retinal hemorrhage, left eye: Secondary | ICD-10-CM | POA: Diagnosis not present

## 2014-06-08 ENCOUNTER — Ambulatory Visit (INDEPENDENT_AMBULATORY_CARE_PROVIDER_SITE_OTHER): Payer: BC Managed Care – PPO | Admitting: Ophthalmology

## 2014-06-08 DIAGNOSIS — H33302 Unspecified retinal break, left eye: Secondary | ICD-10-CM

## 2014-08-16 ENCOUNTER — Encounter (INDEPENDENT_AMBULATORY_CARE_PROVIDER_SITE_OTHER): Payer: BC Managed Care – PPO | Admitting: Ophthalmology

## 2014-08-16 DIAGNOSIS — H33303 Unspecified retinal break, bilateral: Secondary | ICD-10-CM

## 2014-08-16 DIAGNOSIS — H43811 Vitreous degeneration, right eye: Secondary | ICD-10-CM

## 2014-08-16 DIAGNOSIS — H43813 Vitreous degeneration, bilateral: Secondary | ICD-10-CM | POA: Diagnosis not present

## 2014-08-16 DIAGNOSIS — H35413 Lattice degeneration of retina, bilateral: Secondary | ICD-10-CM | POA: Diagnosis not present

## 2014-08-23 ENCOUNTER — Ambulatory Visit (INDEPENDENT_AMBULATORY_CARE_PROVIDER_SITE_OTHER): Payer: BC Managed Care – PPO | Admitting: Ophthalmology

## 2014-08-24 ENCOUNTER — Ambulatory Visit (INDEPENDENT_AMBULATORY_CARE_PROVIDER_SITE_OTHER): Payer: BC Managed Care – PPO | Admitting: Ophthalmology

## 2014-08-24 DIAGNOSIS — H33301 Unspecified retinal break, right eye: Secondary | ICD-10-CM

## 2014-10-17 ENCOUNTER — Ambulatory Visit (INDEPENDENT_AMBULATORY_CARE_PROVIDER_SITE_OTHER): Payer: BC Managed Care – PPO | Admitting: Ophthalmology

## 2014-10-20 ENCOUNTER — Ambulatory Visit (INDEPENDENT_AMBULATORY_CARE_PROVIDER_SITE_OTHER): Payer: BC Managed Care – PPO | Admitting: Ophthalmology

## 2014-12-27 ENCOUNTER — Ambulatory Visit (INDEPENDENT_AMBULATORY_CARE_PROVIDER_SITE_OTHER): Payer: BC Managed Care – PPO | Admitting: Ophthalmology

## 2014-12-27 DIAGNOSIS — H35413 Lattice degeneration of retina, bilateral: Secondary | ICD-10-CM

## 2014-12-27 DIAGNOSIS — H33303 Unspecified retinal break, bilateral: Secondary | ICD-10-CM | POA: Diagnosis not present

## 2014-12-27 DIAGNOSIS — H43813 Vitreous degeneration, bilateral: Secondary | ICD-10-CM

## 2015-03-19 ENCOUNTER — Ambulatory Visit: Payer: BC Managed Care – PPO | Admitting: Obstetrics & Gynecology

## 2015-03-27 ENCOUNTER — Ambulatory Visit (INDEPENDENT_AMBULATORY_CARE_PROVIDER_SITE_OTHER): Payer: BC Managed Care – PPO | Admitting: Obstetrics & Gynecology

## 2015-03-27 ENCOUNTER — Encounter: Payer: Self-pay | Admitting: Obstetrics & Gynecology

## 2015-03-27 ENCOUNTER — Other Ambulatory Visit: Payer: Self-pay

## 2015-03-27 VITALS — BP 100/66 | HR 90 | Resp 16 | Ht 67.75 in | Wt 130.0 lb

## 2015-03-27 DIAGNOSIS — N9489 Other specified conditions associated with female genital organs and menstrual cycle: Secondary | ICD-10-CM

## 2015-03-27 DIAGNOSIS — Z0000 Encounter for general adult medical examination without abnormal findings: Secondary | ICD-10-CM

## 2015-03-27 DIAGNOSIS — Z124 Encounter for screening for malignant neoplasm of cervix: Secondary | ICD-10-CM

## 2015-03-27 DIAGNOSIS — N949 Unspecified condition associated with female genital organs and menstrual cycle: Secondary | ICD-10-CM | POA: Diagnosis not present

## 2015-03-27 DIAGNOSIS — Z1231 Encounter for screening mammogram for malignant neoplasm of breast: Secondary | ICD-10-CM

## 2015-03-27 DIAGNOSIS — N952 Postmenopausal atrophic vaginitis: Secondary | ICD-10-CM

## 2015-03-27 DIAGNOSIS — Z01419 Encounter for gynecological examination (general) (routine) without abnormal findings: Secondary | ICD-10-CM | POA: Diagnosis not present

## 2015-03-27 LAB — POCT URINALYSIS DIPSTICK
Bilirubin, UA: NEGATIVE
Blood, UA: NEGATIVE
Glucose, UA: NEGATIVE
Ketones, UA: NEGATIVE
Leukocytes, UA: NEGATIVE
Nitrite, UA: NEGATIVE
Protein, UA: NEGATIVE
Urobilinogen, UA: NEGATIVE
pH, UA: 7

## 2015-03-27 MED ORDER — LIDOCAINE 5 % EX OINT: TOPICAL_OINTMENT | CUTANEOUS | Status: DC

## 2015-03-27 MED ORDER — VITAMIN D (ERGOCALCIFEROL) 1.25 MG (50000 UNIT) PO CAPS: 50000.0000 [IU] | ORAL_CAPSULE | ORAL | Status: DC

## 2015-03-27 NOTE — Progress Notes (Signed)
PUS scheduled while in office for 03/29/2015 at 4 pm with 4:30 pm consult with Dr.Miller. Patient is agreeable to date and time.

## 2015-03-27 NOTE — Progress Notes (Signed)
60 y.o. G3P1 MarriedCaucasianF here for annual exam.  Doing well.  No vaginal bleeding.  Having a lot of vaginal dryness.  Has no interest in estrogens (and I really agree with this).  Pt has seen something about vaginal laser.  D/W pt information regarding this.  She would consider this.  Referral will be made to Dr. Helane Rima.  Also discussed with pt topical lidocaine.    Has been diagnosed with narrow angle glaucoma.  Had cataracts removed this year which helped the pressure.  Being followed by Dr. Herbert Deaner.  PCP:  Dr. Orland Mustard, [facility].  Blood work was done in the fall last year.  Patient's last menstrual period was 10/01/2008.          Sexually active: Yes.    The current method of family planning is post menopausal status.    Exercising: Yes.    Weight Bearing and aerobics at the gym. Smoker:  no  Health Maintenance: Pap:  02/07/14 Neg, 9/13 neg pap with neg HR HPV History of abnormal Pap:  yes MMG:  04/10/14 Diagnostic Bilateral BIRADS2:Benign  Colonoscopy:  2016 Dr. Collene Mares - repeat 10 years  BMD:  03/18/12, -1.3 TDaP:  08/2009 Zostavax: completed Screening Labs: PCP, Hb today: PCP, Urine today: pending    reports that she has never smoked. She has never used smokeless tobacco. She reports that she drinks about 3.0 oz of alcohol per week. She reports that she does not use illicit drugs.  Past Medical History  Diagnosis Date  . Breast cancer ([place]) 03/2007    Left breast, invasive ductal and DCIS  . Bilateral bunions 1994    Left foot only  . ACL tear     Left ACL  . Tendon injury     Right wrist  . STD (sexually transmitted disease) 2000    exposure/ Chlamydia  . Benign thyroid cyst     on top of thyroid  . Osteopenia     Past Surgical History  Procedure Laterality Date  . Breast lumpectomy with axillary lymph node biopsy Left 04/30/2007  . Arthroscopic repair acl Left 2001  . Bunionectomy Left 1994  . Tendon repair Right 1982  . Hysterotomy  2009  . Tubal ligation   1992  . Dilation and curettage of uterus  1987    D & C x 2  . Wrist surgery  1982  . Hysteroscopic fibroid resection      Current Outpatient Prescriptions  Medication Sig Dispense Refill  . aspirin 81 MG tablet Take 81 mg by mouth daily.    . Grape Seed 100 MG CAPS Take 100 mg by mouth 1 day or 1 dose.    . Multiple Vitamins-Minerals (MULTIVITAMIN WITH MINERALS) tablet Take 1 tablet by mouth daily.    . Vitamin D, Ergocalciferol, (DRISDOL) 50000 UNITS CAPS capsule Take 1 capsule (50,000 Units total) by mouth every 14 (fourteen) days. 6 capsule 4   No current facility-administered medications for this visit.    Family History  Problem Relation Age of Onset  . Cancer Mother     Lung cancer and Breast cancer  . Cancer Father     Lung cancer  . Hypertension Maternal Grandmother   . Heart attack Maternal Grandmother   . Stroke Maternal Grandfather   . Skin cancer Paternal Grandmother   . Lung cancer Other     2 paternal great uncles  . Hypertension Maternal Aunt     x2  . Heart attack Maternal Grandmother   .  Stroke Maternal Grandfather   . Heart attack Maternal Uncle     x2  . Emphysema Father   . Ulcers Mother   . Kidney Stones Father     ROS:  Pertinent items are noted in HPI.  Otherwise, a comprehensive ROS was negative.  Exam:   BP 100/66 mmHg  Pulse 90  Resp 16  Ht 5' 7.75" (1.721 m)  Wt 130 lb (58.968 kg)  BMI 19.91 kg/m2  LMP 10/01/2008  Weight change: -4#    Height: 5' 7.75" (172.1 cm)  Ht Readings from Last 3 Encounters:  03/27/15 5' 7.75" (1.721 m)  02/07/14 5\' 8"  (1.727 m)  01/06/13 5' 8.25" (1.734 m)    General appearance: alert, cooperative and appears stated age Head: Normocephalic, without obvious abnormality, atraumatic Neck: no adenopathy, supple, symmetrical, trachea midline and thyroid normal to inspection and palpation Lungs: clear to auscultation bilaterally Breasts: normal appearance, no masses or tenderness Heart: regular rate and  rhythm Abdomen: soft, non-tender; bowel sounds normal; no masses,  no organomegaly Extremities: extremities normal, atraumatic, no cyanosis or edema Skin: Skin color, texture, turgor normal. No rashes or lesions Lymph nodes: Cervical, supraclavicular, and axillary nodes normal. No abnormal inguinal nodes palpated Neurologic: Grossly normal   Pelvic: External genitalia:  no lesions              Urethra:  normal appearing urethra with no masses, tenderness or lesions              Bartholins and Skenes: normal                 Vagina: normal appearing vagina with normal color and discharge, no lesions              Cervix: no lesions              Pap taken: Yes.   Bimanual Exam:  Uterus:  normal size, contour, position, consistency, mobility, non-tender              Adnexa:  Normal left adnexa, right adnexal fullness               Rectovaginal: Confirms               Anus:  normal sphincter tone, no lesions  Chaperone was present for exam.  A:  Well Woman with normal exam PMP, no HRT H/O breast cancer 1/09, released from oncology 6/14 Osteopenia Low Vit D, last checked with PCP. On every other week dosing.  S/p hysteroscopic fibroid resection 1/11 Right adnexal fullness Significant vaginal atrophy, dysparuenia  P: Mammogram yearly pap smear with neg HR HPV 9/13. Pap with HR HPV today Labs with PCP Vit D 50K every 14 days. #6/4RF.  Return for PUS to check right ovary. Referral to Dr. Helane Rima to consider laser treatment of vaigna Rx to pharmacy for topical lidocaine to be used in a small amount before intercourse.  Return annually or prn

## 2015-03-28 ENCOUNTER — Telehealth: Payer: Self-pay | Admitting: Obstetrics & Gynecology

## 2015-03-28 NOTE — Telephone Encounter (Signed)
Patient called back and I relayed the information about her appointment benefits to her as noted in the patient's guarantor account note. Patient appreciative. Confirmed arrival time for appointment on 03/29/15.

## 2015-03-28 NOTE — Telephone Encounter (Signed)
Called patient to discuss benefits for a procedure. Left Voicemail requesting a call back. °

## 2015-03-28 NOTE — Telephone Encounter (Signed)
Spoke with pt regarding benefit for ultrasound.  Patient understood and agreeable. Patient has already been scheduled for 03/29/15 with Dr Sabra Heck. Pt aware of arrival date and time. I have also advised patient of her referral appointment to a specialist. Ok to close

## 2015-03-28 NOTE — Telephone Encounter (Signed)
Left message for patient to call to convey benefits for ultrasound on 03/29/15 and to convey appointment information for referral to specialist

## 2015-03-29 ENCOUNTER — Ambulatory Visit (INDEPENDENT_AMBULATORY_CARE_PROVIDER_SITE_OTHER): Payer: BC Managed Care – PPO | Admitting: Obstetrics & Gynecology

## 2015-03-29 ENCOUNTER — Ambulatory Visit (INDEPENDENT_AMBULATORY_CARE_PROVIDER_SITE_OTHER): Payer: BC Managed Care – PPO

## 2015-03-29 VITALS — BP 110/76 | HR 80 | Resp 16 | Ht 67.75 in | Wt 130.0 lb

## 2015-03-29 DIAGNOSIS — N9489 Other specified conditions associated with female genital organs and menstrual cycle: Secondary | ICD-10-CM

## 2015-03-29 DIAGNOSIS — N949 Unspecified condition associated with female genital organs and menstrual cycle: Secondary | ICD-10-CM

## 2015-03-29 DIAGNOSIS — D251 Intramural leiomyoma of uterus: Secondary | ICD-10-CM

## 2015-03-29 LAB — IPS PAP TEST WITH HPV

## 2015-03-29 NOTE — Progress Notes (Signed)
60 y.o. G52P1 Married Caucasian female here for pelvic ultrasound due to possible right adnexal mass noted on physical exam.  Pt aware that I could have been feeling stool as well but with hx of breast cancer, decided to proceed with PUS that was recommended.  Denies vaginal bleeding..  Patient's last menstrual period was 10/01/2008.  Contraception: BTL  Findings:  UTERUS: 5.1 x 45 x 3.9cm and 0.8 x 0.6cm fibroid noted.  This is calcified.   EMS: 2.46mm ADNEXA: Left ovary: 1.2 x 0.8 x 1.3cm       Right ovary: 1.7 x 1.1 x [id number] with 13mm follicle noted CUL DE SAC: no free fluid  Discussion:  Ovaries appear normal.  Fibroid was seen with prior PUS several years ago.  No indication for additional evaluation.  Pt comfortable with plan.  Assessment:  Possible adnexal mass on physical exam but PUS showed normal ovaries Small calcified intramural fibroid  Plan:  Follow up for AEX as planned or if new issues/problems develop.

## 2015-04-02 ENCOUNTER — Encounter: Payer: Self-pay | Admitting: Obstetrics & Gynecology

## 2015-04-02 DIAGNOSIS — D251 Intramural leiomyoma of uterus: Secondary | ICD-10-CM | POA: Insufficient documentation

## 2015-04-10 ENCOUNTER — Ambulatory Visit: Payer: BC Managed Care – PPO | Admitting: Obstetrics & Gynecology

## 2015-04-12 ENCOUNTER — Ambulatory Visit
Admission: RE | Admit: 2015-04-12 | Discharge: 2015-04-12 | Disposition: A | Discharge status: Home / Self Care | Payer: BC Managed Care – PPO | Source: Ambulatory Visit

## 2015-04-12 DIAGNOSIS — Z1231 Encounter for screening mammogram for malignant neoplasm of breast: Secondary | ICD-10-CM

## 2015-10-18 ENCOUNTER — Other Ambulatory Visit: Payer: Self-pay | Admitting: Family Medicine

## 2015-10-18 DIAGNOSIS — N951 Menopausal and female climacteric states: Secondary | ICD-10-CM

## 2015-10-22 ENCOUNTER — Other Ambulatory Visit: Payer: Self-pay | Admitting: Family Medicine

## 2015-10-22 DIAGNOSIS — E2839 Other primary ovarian failure: Secondary | ICD-10-CM

## 2015-10-23 ENCOUNTER — Other Ambulatory Visit: Payer: Self-pay | Admitting: Family Medicine

## 2015-10-23 DIAGNOSIS — R0789 Other chest pain: Secondary | ICD-10-CM

## 2015-11-07 ENCOUNTER — Ambulatory Visit
Admission: RE | Admit: 2015-11-07 | Discharge: 2015-11-07 | Disposition: A | Discharge status: Home / Self Care | Payer: BC Managed Care – PPO | Source: Ambulatory Visit | Attending: Family Medicine | Admitting: Family Medicine

## 2015-11-07 DIAGNOSIS — E2839 Other primary ovarian failure: Secondary | ICD-10-CM

## 2015-11-26 ENCOUNTER — Ambulatory Visit
Admission: RE | Admit: 2015-11-26 | Discharge: 2015-11-26 | Disposition: A | Discharge status: Home / Self Care | Payer: BC Managed Care – PPO | Source: Ambulatory Visit | Attending: Family Medicine | Admitting: Family Medicine

## 2015-11-26 DIAGNOSIS — R0789 Other chest pain: Secondary | ICD-10-CM

## 2015-11-30 ENCOUNTER — Encounter: Payer: Self-pay | Admitting: Obstetrics & Gynecology

## 2016-03-21 ENCOUNTER — Other Ambulatory Visit: Payer: Self-pay | Admitting: Obstetrics & Gynecology

## 2016-03-21 DIAGNOSIS — Z1231 Encounter for screening mammogram for malignant neoplasm of breast: Secondary | ICD-10-CM

## 2016-03-21 DIAGNOSIS — Z853 Personal history of malignant neoplasm of breast: Secondary | ICD-10-CM

## 2016-04-24 ENCOUNTER — Ambulatory Visit
Admission: RE | Admit: 2016-04-24 | Discharge: 2016-04-24 | Disposition: A | Discharge status: Home / Self Care | Payer: BC Managed Care – PPO | Source: Ambulatory Visit | Attending: Obstetrics & Gynecology | Admitting: Obstetrics & Gynecology

## 2016-04-24 DIAGNOSIS — Z853 Personal history of malignant neoplasm of breast: Secondary | ICD-10-CM

## 2016-04-24 DIAGNOSIS — Z1231 Encounter for screening mammogram for malignant neoplasm of breast: Secondary | ICD-10-CM

## 2016-05-02 ENCOUNTER — Other Ambulatory Visit: Payer: Self-pay | Admitting: Obstetrics & Gynecology

## 2016-05-02 NOTE — Telephone Encounter (Signed)
Medication refill request: Vitamin D 50,000 units Last AEX:  03/27/15 SM Next AEX: 07/08/16  Last MMG (if hormonal medication request): 04/24/16 BIRADS 1 negative Refill authorized: 03/27/15 #6 w/4 refills; today please advise;

## 2016-07-08 ENCOUNTER — Encounter: Payer: Self-pay | Admitting: Obstetrics & Gynecology

## 2016-07-08 ENCOUNTER — Ambulatory Visit (INDEPENDENT_AMBULATORY_CARE_PROVIDER_SITE_OTHER): Payer: BC Managed Care – PPO | Admitting: Obstetrics & Gynecology

## 2016-07-08 VITALS — BP 110/66 | HR 82 | Resp 16 | Ht 67.5 in | Wt 131.0 lb

## 2016-07-08 DIAGNOSIS — Z205 Contact with and (suspected) exposure to viral hepatitis: Secondary | ICD-10-CM | POA: Diagnosis not present

## 2016-07-08 DIAGNOSIS — Z01419 Encounter for gynecological examination (general) (routine) without abnormal findings: Secondary | ICD-10-CM

## 2016-07-08 DIAGNOSIS — C44310 Basal cell carcinoma of skin of unspecified parts of face: Secondary | ICD-10-CM | POA: Diagnosis not present

## 2016-07-08 MED ORDER — NONFORMULARY OR COMPOUNDED ITEM: 3 refills | Status: DC

## 2016-07-08 MED ORDER — VITAMIN D (ERGOCALCIFEROL) 1.25 MG (50000 UNIT) PO CAPS
50000.0000 [IU] | ORAL_CAPSULE | ORAL | 3 refills | Status: DC
Start: 2016-07-08 — End: 2017-07-11

## 2016-07-08 NOTE — Progress Notes (Signed)
61 y.o. G3P1 MarriedCaucasianF here for annual exam.  Did the Michele Cabrera touch last year.  Pain is much better and even her spouse can tell there is a difference.  D/W pt trial of Vit E suppositories.   PCP:  Dr. Orland Mustard.  Has appt scheduled for late summer.  Is considering the Shingrix vaccine.  Patient's last menstrual period was 10/01/2008.          Sexually active: Yes.    The current method of family planning is post menopausal status.    Exercising: Yes.    gym, cross training x 1 week Smoker:  no  Health Maintenance: Pap:  03/27/15 Neg. HR HPV:neg  02/07/14 Neg  History of abnormal Pap:  yes MMG:  04/24/16 BIRADS1:Neg  Colonoscopy:  2016 Normal. f/u 10 years  BMD:   11/07/15 Osteopenia  TDaP:  08/2009 Pneumonia vaccine(s):  No Zostavax:   Done with PCP Hep C testing: Not done  Screening Labs: PCP.  Appt scheduled in late Aug or early Sept   reports that she has never smoked. She has never used smokeless tobacco. She reports that she drinks about 3.0 oz of alcohol per week . She reports that she does not use drugs.  Past Medical History:  Diagnosis Date  . ACL tear    Left ACL  . Basal cell carcinoma (BCC) of face    06/2016  . Benign thyroid cyst    on top of thyroid  . Bilateral bunions 1994   Left foot only  . Breast cancer ([place]) 03/2007   Left breast, invasive ductal and DCIS  . Glaucoma 2016   Dr. Herbert Deaner  . Osteopenia   . STD (sexually transmitted disease) 2000   exposure/ Chlamydia  . Tendon injury    Right wrist    Past Surgical History:  Procedure Laterality Date  . ARTHROSCOPIC REPAIR ACL Left 2001  . BREAST LUMPECTOMY WITH AXILLARY LYMPH NODE BIOPSY Left 04/30/2007  . bunionectomy Left 1994  . CATARACT EXTRACTION  2/16, 9/16   Dr. Herbert Deaner  . DILATION AND CURETTAGE OF UTERUS  1987   D & C x 2  . EXCISION / BIOPSY BREAST / NIPPLE / DUCT Left   . hysteroscopic fibroid resection    . HYSTEROTOMY  2009  . TENDON REPAIR Right 1982  . TUBAL LIGATION  1992  .  WRIST SURGERY  1982    Current Outpatient Prescriptions  Medication Sig Dispense Refill  . aspirin 81 MG tablet Take 81 mg by mouth daily.    . Calcium-Magnesium-Vitamin D (CALCIUM 1200+D3 PO) Take by mouth daily.    . Grape Seed 100 MG CAPS Take 100 mg by mouth 1 day or 1 dose.    . Multiple Vitamins-Minerals (MULTIVITAMIN WITH MINERALS) tablet Take 1 tablet by mouth daily.    . Vitamin D, Ergocalciferol, (DRISDOL) 50000 units CAPS capsule TAKE 1 CAPSULE BY MOUTH EVERY 14 DAYS 6 capsule 0   No current facility-administered medications for this visit.     Family History  Problem Relation Age of Onset  . Cancer Mother     Lung cancer and Breast cancer  . Ulcers Mother   . Cancer Father     Lung cancer  . Emphysema Father   . Kidney Stones Father   . Hypertension Maternal Grandmother   . Heart attack Maternal Grandmother   . Stroke Maternal Grandfather   . Skin cancer Paternal Grandmother   . Lung cancer Other     2  paternal great uncles  . Hypertension Maternal Aunt     x2  . Heart attack Maternal Uncle     x2    ROS:  Pertinent items are noted in HPI.  Otherwise, a comprehensive ROS was negative.  Exam:   BP 110/66 (BP Location: Right Arm, Patient Position: Sitting, Cuff Size: Normal)   Pulse 82   Resp 16   Ht 5' 7.5" (1.715 m)   Wt 131 lb (59.4 kg)   LMP 10/01/2008   BMI 20.21 kg/m   Weight change:    Height: 5' 7.5" (171.5 cm)  Ht Readings from Last 3 Encounters:  07/08/16 5' 7.5" (1.715 m)  03/29/15 5' 7.75" (1.721 m)  03/27/15 5' 7.75" (1.721 m)    General appearance: alert, cooperative and appears stated age Head: Normocephalic, without obvious abnormality, atraumatic Neck: no adenopathy, supple, symmetrical, trachea midline and thyroid normal to inspection and palpation Lungs: clear to auscultation bilaterally Breasts: normal appearance, no masses or tenderness, well healed scar left inferior portion of breast, no changes Heart: regular rate and  rhythm Abdomen: soft, non-tender; bowel sounds normal; no masses,  no organomegaly Extremities: extremities normal, atraumatic, no cyanosis or edema Skin: Skin color, texture, turgor normal. No rashes or lesions Lymph nodes: Cervical, supraclavicular, and axillary nodes normal. No abnormal inguinal nodes palpated Neurologic: Grossly normal   Pelvic: External genitalia:  no lesions              Urethra:  normal appearing urethra with no masses, tenderness or lesions              Bartholins and Skenes: normal                 Vagina: normal appearing vagina with normal color and discharge, no lesions,atrophic changes              Cervix: no lesions              Pap taken: No. Bimanual Exam:  Uterus:  normal size, contour, position, consistency, mobility, non-tender              Adnexa: normal adnexa and no mass, fullness, tenderness               Rectovaginal: Confirms               Anus:  normal sphincter tone, no lesions  Chaperone was present for exam.  A:  Well Woman with normal exam PMP, no HRT H/O breast cancer 1/09, released from oncology 6/14 Osteopenia H/O hysteroscopic fibroid resection 1/11 H/O small uterine fibroid Vaginal atrophy with h/o dyspareunia, much improved after Michele Cabrera tough  P:   Mammogram guidelines reviewed pap smear not obtained today.  Will repeat next year. Trial of Vit E suppositories.  1pv two to three times weekly. RF for Vit D 50k every 14 days.  #6/3RF Hep C antibody obtained today return annually or prn

## 2016-07-09 LAB — HEPATITIS C ANTIBODY: HCV Ab: NEGATIVE

## 2016-12-01 HISTORY — PX: MOHS SURGERY: SUR867

## 2017-03-03 DIAGNOSIS — M779 Enthesopathy, unspecified: Secondary | ICD-10-CM

## 2017-03-03 HISTORY — DX: Enthesopathy, unspecified: M77.9

## 2017-05-26 ENCOUNTER — Other Ambulatory Visit: Payer: Self-pay | Admitting: Obstetrics & Gynecology

## 2017-05-26 ENCOUNTER — Other Ambulatory Visit: Payer: Self-pay | Admitting: Family Medicine

## 2017-05-26 DIAGNOSIS — Z1231 Encounter for screening mammogram for malignant neoplasm of breast: Secondary | ICD-10-CM

## 2017-06-12 ENCOUNTER — Ambulatory Visit
Admission: RE | Admit: 2017-06-12 | Discharge: 2017-06-12 | Disposition: A | Discharge status: Home / Self Care | Payer: BC Managed Care – PPO | Source: Ambulatory Visit | Attending: Obstetrics & Gynecology | Admitting: Obstetrics & Gynecology

## 2017-06-12 DIAGNOSIS — Z1231 Encounter for screening mammogram for malignant neoplasm of breast: Secondary | ICD-10-CM

## 2017-06-26 ENCOUNTER — Ambulatory Visit
Admission: RE | Admit: 2017-06-26 | Discharge: 2017-06-26 | Disposition: A | Discharge status: Home / Self Care | Payer: BC Managed Care – PPO | Source: Ambulatory Visit | Attending: Family Medicine | Admitting: Family Medicine

## 2017-06-26 ENCOUNTER — Other Ambulatory Visit: Payer: Self-pay | Admitting: Family Medicine

## 2017-07-11 ENCOUNTER — Other Ambulatory Visit: Payer: Self-pay | Admitting: Obstetrics & Gynecology

## 2017-07-13 NOTE — Telephone Encounter (Signed)
Medication refill request: vitamin D 50,000IU Last AEX:  07-08-16 Next AEX: 09-18-17 Last MMG (if hormonal medication request): 06-12-17 RUE:[id number] Refill authorized: please advise

## 2017-08-06 ENCOUNTER — Other Ambulatory Visit: Payer: Self-pay | Admitting: Obstetrics & Gynecology

## 2017-09-04 ENCOUNTER — Other Ambulatory Visit: Payer: Self-pay | Admitting: Obstetrics & Gynecology

## 2017-09-04 NOTE — Telephone Encounter (Signed)
Will refill x 1 only

## 2017-09-04 NOTE — Telephone Encounter (Signed)
Medication refill request:VITAMIN D2 1.25MG (50,000 UNIT) Last AEX:  07/08/16 Next AEX: 7/19/9 Last MMG (if hormonal medication request): 06/12/17 Bi-rads Category 1 Neg Refill authorized: [place] refill if appropriate.

## 2017-09-18 ENCOUNTER — Ambulatory Visit: Payer: BC Managed Care – PPO | Admitting: Obstetrics & Gynecology

## 2017-09-18 ENCOUNTER — Other Ambulatory Visit (HOSPITAL_COMMUNITY)
Admission: RE | Admit: 2017-09-18 | Discharge: 2017-09-18 | Disposition: A | Discharge status: Home / Self Care | Payer: BC Managed Care – PPO | Source: Ambulatory Visit | Attending: Obstetrics & Gynecology | Admitting: Obstetrics & Gynecology

## 2017-09-18 ENCOUNTER — Encounter

## 2017-09-18 ENCOUNTER — Encounter: Payer: Self-pay | Admitting: Obstetrics & Gynecology

## 2017-09-18 ENCOUNTER — Other Ambulatory Visit: Payer: Self-pay

## 2017-09-18 VITALS — BP 100/70 | HR 80 | Resp 16 | Ht 67.75 in | Wt 132.4 lb

## 2017-09-18 DIAGNOSIS — Z124 Encounter for screening for malignant neoplasm of cervix: Secondary | ICD-10-CM | POA: Insufficient documentation

## 2017-09-18 DIAGNOSIS — Z01419 Encounter for gynecological examination (general) (routine) without abnormal findings: Secondary | ICD-10-CM | POA: Diagnosis not present

## 2017-09-18 MED ORDER — VITAMIN D (ERGOCALCIFEROL) 1.25 MG (50000 UNIT) PO CAPS: ORAL_CAPSULE | ORAL | 4 refills | Status: DC
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
## 2017-09-21 LAB — CYTOLOGY - PAP: Diagnosis: NEGATIVE

## 2017-10-26 ENCOUNTER — Other Ambulatory Visit: Payer: Self-pay | Admitting: Family Medicine

## 2017-10-26 DIAGNOSIS — M8588 Other specified disorders of bone density and structure, other site: Secondary | ICD-10-CM

## 2017-12-16 ENCOUNTER — Ambulatory Visit
Admission: RE | Admit: 2017-12-16 | Discharge: 2017-12-16 | Disposition: A | Discharge status: Home / Self Care | Payer: BC Managed Care – PPO | Source: Ambulatory Visit | Attending: Family Medicine | Admitting: Family Medicine

## 2017-12-16 DIAGNOSIS — M8588 Other specified disorders of bone density and structure, other site: Secondary | ICD-10-CM

## 2018-03-27 ENCOUNTER — Other Ambulatory Visit: Payer: Self-pay | Admitting: Cardiology

## 2018-03-27 DIAGNOSIS — R002 Palpitations: Secondary | ICD-10-CM

## 2018-04-22 ENCOUNTER — Ambulatory Visit: Payer: BC Managed Care – PPO

## 2018-04-22 DIAGNOSIS — R002 Palpitations: Secondary | ICD-10-CM

## 2018-04-28 ENCOUNTER — Telehealth: Payer: Self-pay | Admitting: Cardiology

## 2018-04-28 DIAGNOSIS — I341 Nonrheumatic mitral (valve) prolapse: Secondary | ICD-10-CM

## 2018-04-28 NOTE — Telephone Encounter (Signed)
I called the patient to discuss the result below.  Echocardiogram 04/22/2018: Left ventricle cavity is normal in size. Normal global wall motion. Doppler evidence of grade I (impaired) diastolic dysfunction, normal LAP. Calculated EF 67%. Left atrial cavity is mildly dilated. Myxomatous degeneration with bileaflet prolapse.  Moderate, mid to late systolic, eccentric, posteriorly directed mitral regurgitation. Normal right atrial pressure.   No answer. Left a voicemail. I would like to discuss the results with the patient at a convenient time. Recommend repeat echocardiogram in 04/2019, unless symptoms of shortness of breath, leg edema noted.   Nigel Mormon, MD [facility] Pager: [phone] Office: [phone] If no answer Cell [phone]

## 2018-04-28 NOTE — Telephone Encounter (Signed)
Patient called me today.  I discussed echocardiogram results with her.  She reported 1 episode of palpitations.  I have informed her that if she continues to have recurrent symptoms, except would be to use an event monitor to establish if there are any arrhythmias.  Otherwise, repeat echocardiogram in February 2021, already ordered.

## 2018-07-29 ENCOUNTER — Other Ambulatory Visit: Payer: Self-pay | Admitting: Family Medicine

## 2018-07-29 DIAGNOSIS — Z1231 Encounter for screening mammogram for malignant neoplasm of breast: Secondary | ICD-10-CM

## 2018-09-06 DIAGNOSIS — M67449 Ganglion, unspecified hand: Secondary | ICD-10-CM | POA: Insufficient documentation

## 2018-09-06 DIAGNOSIS — M79644 Pain in right finger(s): Secondary | ICD-10-CM | POA: Insufficient documentation

## 2018-09-14 ENCOUNTER — Other Ambulatory Visit: Payer: Self-pay

## 2018-09-14 ENCOUNTER — Ambulatory Visit
Admission: RE | Admit: 2018-09-14 | Discharge: 2018-09-14 | Disposition: A | Discharge status: Home / Self Care | Payer: BC Managed Care – PPO | Source: Ambulatory Visit | Attending: Family Medicine | Admitting: Family Medicine

## 2018-09-14 DIAGNOSIS — Z1231 Encounter for screening mammogram for malignant neoplasm of breast: Secondary | ICD-10-CM

## 2018-09-15 ENCOUNTER — Other Ambulatory Visit: Payer: Self-pay | Admitting: Family Medicine

## 2018-09-15 ENCOUNTER — Other Ambulatory Visit: Payer: Self-pay | Admitting: Obstetrics & Gynecology

## 2018-09-15 DIAGNOSIS — R928 Other abnormal and inconclusive findings on diagnostic imaging of breast: Secondary | ICD-10-CM

## 2018-09-16 ENCOUNTER — Ambulatory Visit
Admission: RE | Admit: 2018-09-16 | Discharge: 2018-09-16 | Disposition: A | Discharge status: Home / Self Care | Payer: BC Managed Care – PPO | Source: Ambulatory Visit | Attending: Family Medicine | Admitting: Family Medicine

## 2018-09-16 ENCOUNTER — Other Ambulatory Visit: Payer: Self-pay

## 2018-09-16 ENCOUNTER — Other Ambulatory Visit: Payer: Self-pay | Admitting: Obstetrics & Gynecology

## 2018-09-16 DIAGNOSIS — R928 Other abnormal and inconclusive findings on diagnostic imaging of breast: Secondary | ICD-10-CM

## 2018-09-16 DIAGNOSIS — N6310 Unspecified lump in the right breast, unspecified quadrant: Secondary | ICD-10-CM

## 2018-11-16 ENCOUNTER — Other Ambulatory Visit: Payer: Self-pay | Admitting: Obstetrics & Gynecology

## 2018-11-16 NOTE — Telephone Encounter (Signed)
Medication refill request: Vitamin D Last AEX:  09/18/2017 SM Next AEX: 01/11/2019 Last MMG (if hormonal medication request): BIRADS 3 Probably benign Density C, U/S confirmed cyst Refill authorized: Pending authorization. #6 with 1 refill if appropriate. Please advise.

## 2018-12-14 DIAGNOSIS — H9312 Tinnitus, left ear: Secondary | ICD-10-CM | POA: Insufficient documentation

## 2018-12-28 ENCOUNTER — Other Ambulatory Visit: Payer: Self-pay

## 2018-12-28 DIAGNOSIS — Z20822 Contact with and (suspected) exposure to covid-19: Secondary | ICD-10-CM

## 2018-12-31 LAB — NOVEL CORONAVIRUS, NAA: SARS-CoV-2, NAA: NOT DETECTED

## 2019-01-07 ENCOUNTER — Other Ambulatory Visit: Payer: Self-pay

## 2019-01-11 ENCOUNTER — Encounter: Payer: Self-pay | Admitting: Obstetrics & Gynecology

## 2019-01-11 ENCOUNTER — Ambulatory Visit: Payer: BC Managed Care – PPO | Admitting: Obstetrics & Gynecology

## 2019-01-11 ENCOUNTER — Other Ambulatory Visit: Payer: Self-pay

## 2019-01-11 VITALS — BP 128/70 | HR 88 | Temp 97.2°F | Resp 12 | Ht 67.25 in | Wt 132.0 lb

## 2019-01-11 DIAGNOSIS — Z01419 Encounter for gynecological examination (general) (routine) without abnormal findings: Secondary | ICD-10-CM
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
## 2019-03-23 ENCOUNTER — Ambulatory Visit
Admission: RE | Admit: 2019-03-23 | Discharge: 2019-03-23 | Disposition: A | Discharge status: Home / Self Care | Payer: BC Managed Care – PPO | Source: Ambulatory Visit | Attending: Obstetrics & Gynecology | Admitting: Obstetrics & Gynecology

## 2019-03-23 ENCOUNTER — Other Ambulatory Visit: Payer: Self-pay

## 2019-03-23 DIAGNOSIS — N6310 Unspecified lump in the right breast, unspecified quadrant: Secondary | ICD-10-CM

## 2019-04-01 ENCOUNTER — Ambulatory Visit: Payer: BC Managed Care – PPO

## 2019-04-06 ENCOUNTER — Ambulatory Visit: Payer: BC Managed Care – PPO

## 2019-04-07 ENCOUNTER — Ambulatory Visit: Payer: BC Managed Care – PPO | Attending: Internal Medicine

## 2019-04-07 DIAGNOSIS — Z23 Encounter for immunization: Secondary | ICD-10-CM | POA: Insufficient documentation

## 2019-04-07 NOTE — Progress Notes (Signed)
   Covid-19 Vaccination Clinic  Name:  Michele Cabrera    MRN: [id number] DOB: [birth year]  04/07/2019  Michele Cabrera was observed post Covid-19 immunization for 30 minutes based on pre-vaccination screening without incidence. She was provided with Vaccine Information Sheet and instruction to access the V-Safe system.   Michele Cabrera was instructed to call 911 with any severe reactions post vaccine: Marland Kitchen Difficulty breathing  . Swelling of your face and throat  . A fast heartbeat  . A bad rash all over your body  . Dizziness and weakness    Immunizations Administered    Name Date Dose VIS Date Route   Pfizer COVID-19 Vaccine 04/07/2019  5:52 PM 0.3 mL 02/11/2019 Intramuscular   Manufacturer: [place]   Lot: [id number]   [place]: [id number]

## 2019-05-03 ENCOUNTER — Ambulatory Visit: Payer: Self-pay

## 2019-05-03 ENCOUNTER — Ambulatory Visit: Payer: BC Managed Care – PPO | Attending: Internal Medicine

## 2019-05-03 DIAGNOSIS — Z23 Encounter for immunization: Secondary | ICD-10-CM

## 2019-05-03 NOTE — Progress Notes (Signed)
   Covid-19 Vaccination Clinic  Name:  Michele Cabrera    MRN: [id number] DOB: 11/18/1955  05/03/2019  Michele Cabrera was observed post Covid-19 immunization for 15 minutes without incident. She was provided with Vaccine Information Sheet and instruction to access the V-Safe system.   Michele Cabrera was instructed to call 911 with any severe reactions post vaccine: Marland Kitchen Difficulty breathing  . Swelling of face and throat  . A fast heartbeat  . A bad rash all over body  . Dizziness and weakness   Immunizations Administered    Name Date Dose VIS Date Route   Pfizer COVID-19 Vaccine 05/03/2019  4:16 PM 0.3 mL 02/11/2019 Intramuscular   Manufacturer: [place]   Lot: [id number]   [place]: [id number]

## 2019-08-02 ENCOUNTER — Other Ambulatory Visit: Payer: Self-pay | Admitting: Obstetrics & Gynecology

## 2019-08-02 DIAGNOSIS — Z1231 Encounter for screening mammogram for malignant neoplasm of breast: Secondary | ICD-10-CM

## 2019-09-15 ENCOUNTER — Ambulatory Visit
Admission: RE | Admit: 2019-09-15 | Discharge: 2019-09-15 | Disposition: A | Discharge status: Home / Self Care | Payer: BC Managed Care – PPO | Source: Ambulatory Visit | Attending: Obstetrics & Gynecology | Admitting: Obstetrics & Gynecology

## 2019-09-15 ENCOUNTER — Other Ambulatory Visit: Payer: Self-pay

## 2019-09-15 DIAGNOSIS — Z1231 Encounter for screening mammogram for malignant neoplasm of breast: Secondary | ICD-10-CM

## 2019-10-04 ENCOUNTER — Other Ambulatory Visit: Payer: Self-pay | Admitting: Podiatry

## 2019-10-04 ENCOUNTER — Ambulatory Visit (INDEPENDENT_AMBULATORY_CARE_PROVIDER_SITE_OTHER): Payer: BC Managed Care – PPO

## 2019-10-04 ENCOUNTER — Other Ambulatory Visit: Payer: Self-pay

## 2019-10-04 ENCOUNTER — Ambulatory Visit: Payer: BC Managed Care – PPO | Admitting: Podiatry

## 2019-10-04 DIAGNOSIS — M25472 Effusion, left ankle: Secondary | ICD-10-CM

## 2019-10-04 DIAGNOSIS — S92504A Nondisplaced unspecified fracture of right lesser toe(s), initial encounter for closed fracture: Secondary | ICD-10-CM

## 2019-10-04 DIAGNOSIS — M779 Enthesopathy, unspecified: Secondary | ICD-10-CM

## 2019-10-04 DIAGNOSIS — M25572 Pain in left ankle and joints of left foot: Secondary | ICD-10-CM

## 2019-10-04 DIAGNOSIS — M7989 Other specified soft tissue disorders: Secondary | ICD-10-CM | POA: Diagnosis not present

## 2019-10-04 DIAGNOSIS — M79671 Pain in right foot: Secondary | ICD-10-CM

## 2019-10-06 ENCOUNTER — Other Ambulatory Visit: Payer: Self-pay | Admitting: Podiatry

## 2019-10-06 DIAGNOSIS — M779 Enthesopathy, unspecified: Secondary | ICD-10-CM

## 2019-10-10 ENCOUNTER — Telehealth: Payer: Self-pay | Admitting: *Deleted

## 2019-10-10 DIAGNOSIS — M7989 Other specified soft tissue disorders: Secondary | ICD-10-CM

## 2019-10-10 DIAGNOSIS — Z01812 Encounter for preprocedural laboratory examination: Secondary | ICD-10-CM

## 2019-10-10 DIAGNOSIS — M25472 Effusion, left ankle: Secondary | ICD-10-CM

## 2019-10-10 NOTE — Telephone Encounter (Signed)
-----   Message from Trula Slade, DPM sent at 10/10/2019  7:43 AM EDT ----- Can you please order a MRI of the left ankle with contrast? She has knots present on the medial ankle proximal to the malleolus.x-rays negative. thanks

## 2019-10-10 NOTE — Telephone Encounter (Signed)
Orders to Dr. Lawana Chambers, CMA for pre-cert and faxed to [place].
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
## 2019-11-01 ENCOUNTER — Ambulatory Visit
Admission: RE | Admit: 2019-11-01 | Discharge: 2019-11-01 | Disposition: A | Discharge status: Home / Self Care | Payer: BC Managed Care – PPO | Source: Ambulatory Visit | Attending: Podiatry | Admitting: Podiatry

## 2019-11-01 ENCOUNTER — Other Ambulatory Visit: Payer: Self-pay

## 2019-11-01 DIAGNOSIS — M25472 Effusion, left ankle: Secondary | ICD-10-CM

## 2019-11-01 DIAGNOSIS — M7989 Other specified soft tissue disorders: Secondary | ICD-10-CM

## 2019-11-01 MED ORDER — GADOBENATE DIMEGLUMINE 529 MG/ML IV SOLN
12.0000 mL | Freq: Once | INTRAVENOUS | Status: AC | PRN
  Administered 2019-11-01: 12 mL via INTRAVENOUS

## 2019-11-04 ENCOUNTER — Encounter: Payer: Self-pay | Admitting: Podiatry

## 2019-12-05 ENCOUNTER — Other Ambulatory Visit: Payer: Self-pay

## 2019-12-05 ENCOUNTER — Ambulatory Visit (INDEPENDENT_AMBULATORY_CARE_PROVIDER_SITE_OTHER): Payer: BC Managed Care – PPO | Admitting: Podiatry

## 2019-12-05 DIAGNOSIS — S92504D Nondisplaced unspecified fracture of right lesser toe(s), subsequent encounter for fracture with routine healing: Secondary | ICD-10-CM | POA: Diagnosis not present

## 2019-12-05 DIAGNOSIS — M25472 Effusion, left ankle: Secondary | ICD-10-CM

## 2019-12-05 NOTE — Progress Notes (Signed)
Subjective: 64 year old female presents the office today for follow-up evaluation of right fourth toe fracture as well as chronic swelling to her left ankle. She states the ankle swelling does not cause of the discomfort but she notices it towards the end of the day. She has a small 'knots' around her ankle and she states this likely started from field hockey several years ago. MRI was performed she presents today for follow-up. Denies any systemic complaints such as fevers, chills, nausea, vomiting. No acute changes since last appointment, and no other complaints at this time.   MRI 11/01/2019 IMPRESSION 1. Attenuation of the anterior talofibular ligament at the fibular attachment concerning for prior injury without complete disruption. 2. No soft tissue mass or focal abnormality at the site of clinical concern overlying the medial malleolus.  Objective: AAO x3, NAD DP/PT pulses palpable bilaterally, CRT less than 3 seconds Adductovarus present in the right fourth toe with minimal swelling. It is flexible and there is no significant pain on exam today.  To left ankle there are small palpable nodules present on the medial ankle and is appears to be more muscular in nature. There is no drainable collection identified. No significant pain on exam. Mild chronic edema to left ankle compared to contralateral extremity. No pain with calf compression, swelling, warmth, erythema  Assessment: 64 year old female with right fourth toe fracture; left ankle swelling  Plan: -All treatment options discussed with the patient including all alternatives, risks, complications.  -In regards to the toe she is not having significant discomfort. She will occasionally get some rubbing from the shoe. Discussed in the future surgical intervention however in the meantime we discussed shoe modifications as well as stretching/strengthening exercises for her toes. -Regards the MRI we reviewed the study. There is no  collection of fluid and unable to drain. Offered steroid injection but is not causing significant pain. I do not think the steroid injection will help significantly the swelling. I will continue with compression sock. At this point this is been a chronic issue for several years.  Trula Slade DPM

## 2020-04-06 ENCOUNTER — Ambulatory Visit: Payer: BC Managed Care – PPO | Admitting: Obstetrics & Gynecology

## 2020-06-11 ENCOUNTER — Ambulatory Visit (INDEPENDENT_AMBULATORY_CARE_PROVIDER_SITE_OTHER): Payer: BC Managed Care – PPO | Admitting: Obstetrics & Gynecology

## 2020-06-11 ENCOUNTER — Other Ambulatory Visit: Payer: Self-pay

## 2020-06-11 ENCOUNTER — Encounter (HOSPITAL_BASED_OUTPATIENT_CLINIC_OR_DEPARTMENT_OTHER): Payer: Self-pay | Admitting: Obstetrics & Gynecology

## 2020-06-11 VITALS — BP 129/74 | HR 71 | Resp 18 | Ht 67.5 in | Wt 133.6 lb

## 2020-06-11 DIAGNOSIS — Z01419 Encounter for gynecological examination (general) (routine) without abnormal findings: Secondary | ICD-10-CM

## 2020-06-11 DIAGNOSIS — Z780 Asymptomatic menopausal state: Secondary | ICD-10-CM

## 2020-06-11 DIAGNOSIS — Z170 Estrogen receptor positive status [ER+]: Secondary | ICD-10-CM

## 2020-06-11 DIAGNOSIS — M810 Age-related osteoporosis without current pathological fracture: Secondary | ICD-10-CM | POA: Diagnosis not present

## 2020-06-11 DIAGNOSIS — C50412 Malignant neoplasm of upper-outer quadrant of left female breast: Secondary | ICD-10-CM | POA: Diagnosis not present
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
## 2020-08-02 ENCOUNTER — Other Ambulatory Visit: Payer: Self-pay | Admitting: Obstetrics & Gynecology

## 2020-08-02 DIAGNOSIS — Z1231 Encounter for screening mammogram for malignant neoplasm of breast: Secondary | ICD-10-CM

## 2020-08-24 ENCOUNTER — Other Ambulatory Visit: Payer: Self-pay

## 2020-08-24 ENCOUNTER — Ambulatory Visit
Admission: RE | Admit: 2020-08-24 | Discharge: 2020-08-24 | Disposition: A | Discharge status: Home / Self Care | Payer: BC Managed Care – PPO | Source: Ambulatory Visit | Attending: Obstetrics & Gynecology | Admitting: Obstetrics & Gynecology

## 2020-08-24 DIAGNOSIS — M810 Age-related osteoporosis without current pathological fracture: Secondary | ICD-10-CM

## 2020-09-05 ENCOUNTER — Telehealth (HOSPITAL_BASED_OUTPATIENT_CLINIC_OR_DEPARTMENT_OTHER): Payer: Self-pay

## 2020-09-05 NOTE — Telephone Encounter (Signed)
Patient called the office back to receive bone density results and recommendations. Patient wanted to let Dr. Sabra Heck know that she was previously taking Fosamax. She stopped the Fosamax due to some "jaw pain" that she was experiencing. She now does not believe that the jaw pain was coming from the medication because the jaw pain is still there although she has stopped the medication. She would like to know if you want her to restart the Fosamax. Please advise. tbw

## 2020-09-07 ENCOUNTER — Other Ambulatory Visit (HOSPITAL_BASED_OUTPATIENT_CLINIC_OR_DEPARTMENT_OTHER): Payer: Self-pay

## 2020-09-07 MED ORDER — ALENDRONATE SODIUM 70 MG PO TABS: ORAL_TABLET | ORAL | 3 refills | Status: DC

## 2020-09-07 NOTE — Telephone Encounter (Signed)
Called and spoke to patient about the below message. Patient expressed understanding and will restart the Fosamax. Patient did need a refill and prescription was sent into the requested pharmacy. [facility] [address]. tbw

## 2020-09-26 ENCOUNTER — Ambulatory Visit
Admission: RE | Admit: 2020-09-26 | Discharge: 2020-09-26 | Disposition: A | Discharge status: Home / Self Care | Payer: BC Managed Care – PPO | Source: Ambulatory Visit | Attending: Obstetrics & Gynecology | Admitting: Obstetrics & Gynecology

## 2020-09-26 ENCOUNTER — Other Ambulatory Visit: Payer: Self-pay

## 2020-09-26 DIAGNOSIS — Z1231 Encounter for screening mammogram for malignant neoplasm of breast: Secondary | ICD-10-CM

## 2020-11-29 ENCOUNTER — Other Ambulatory Visit (HOSPITAL_BASED_OUTPATIENT_CLINIC_OR_DEPARTMENT_OTHER): Payer: Self-pay | Admitting: Obstetrics & Gynecology

## 2021-01-15 IMAGING — MR MR ANKLE*L* WO/W CM
9 series · 36 of 40 positions shown · IV contrast (12 ml Multihance)
Comparison: None.

CLINICAL DATA: Knot on the medial aspect of the ankle. Left ankle
pain and swelling for over 1 year.

EXAM:
MRI OF THE LEFT ANKLE WITHOUT AND WITH CONTRAST
TECHNIQUE: Multiplanar, multisequence MR imaging of the ankle was performed
before and after the administration of intravenous contrast.
CONTRAST:  12mL MULTIHANCE GADOBENATE DIMEGLUMINE 529 MG/ML IV SOLN

[Series 3: T2 fat-sat · axial · 3.0mm · 0.66mm/px · z∈[-96,+44]mm · 6 of 40 slices shown (1 of 2)]
[im 1/40]
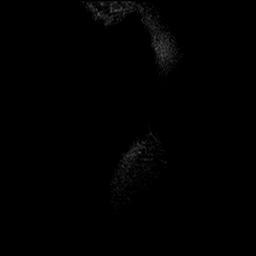
[im 8/40]
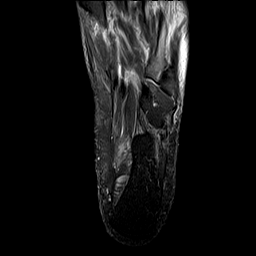
[im 16/40]
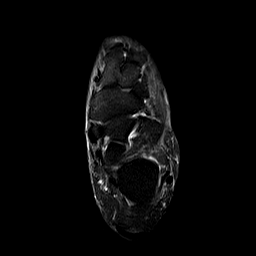
[im 24/40]
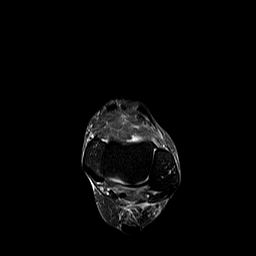
[im 32/40]
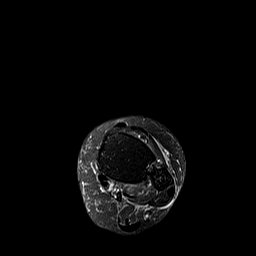
[im 40/40]
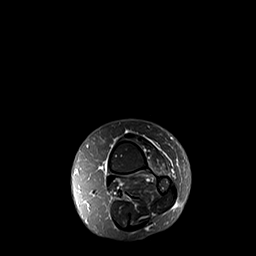

[Series 4: PD fat-sat · axial · 3.0mm · 0.66mm/px · z∈[-105,+35]mm · 5 of 40 slices shown]
[im 1/40]
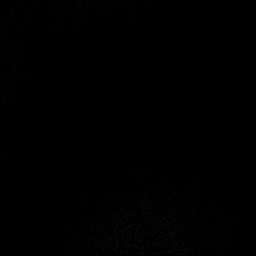
[im 10/40]
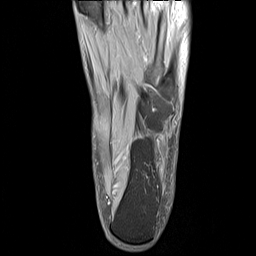
[im 20/40]
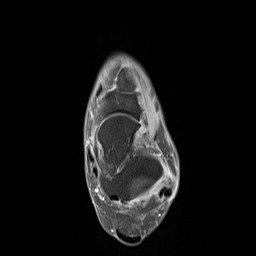
[im 30/40]
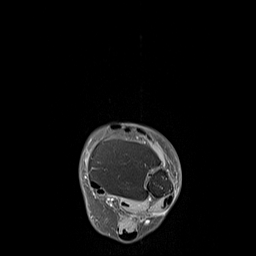
[im 40/40]
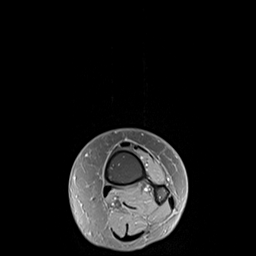

[Series 5: T1 · sagittal · 3.0mm · 0.31mm/px · 3 of 22 slices shown]
[im 1/22]
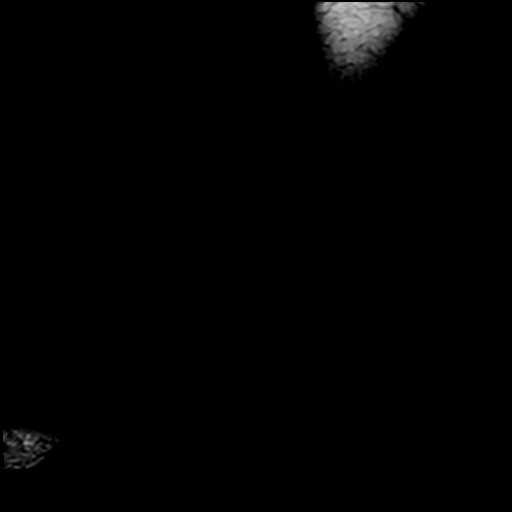
[im 11/22]
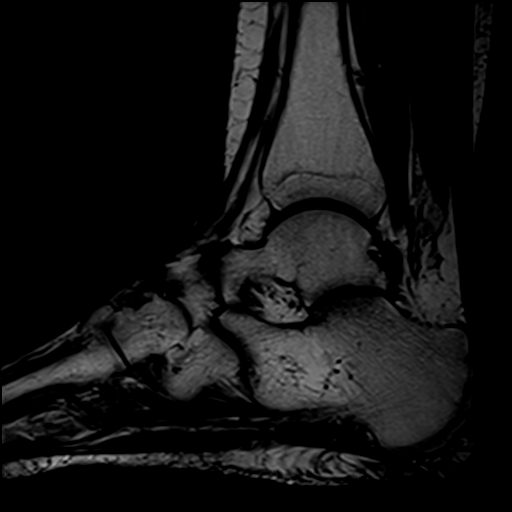
[im 22/22]
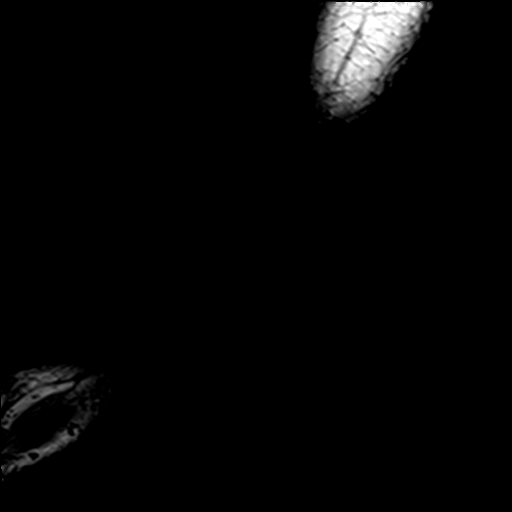

[Series 6: STIR · sagittal · 3.0mm · 0.62mm/px · 3 of 22 slices shown]
[im 1/22]
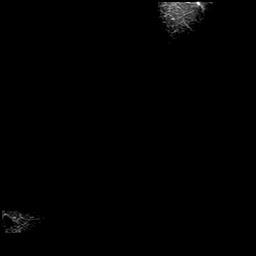
[im 11/22]
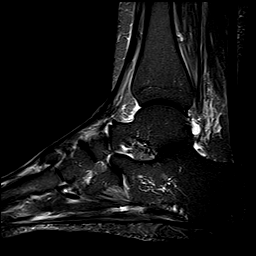
[im 22/22]
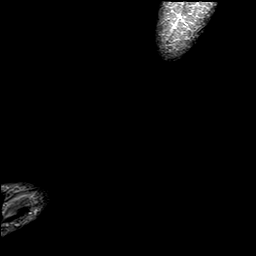

[Series 7: T2 fat-sat · coronal · 3.0mm · 0.31mm/px · 5 of 42 slices shown (2 of 2)]
[im 1/42]
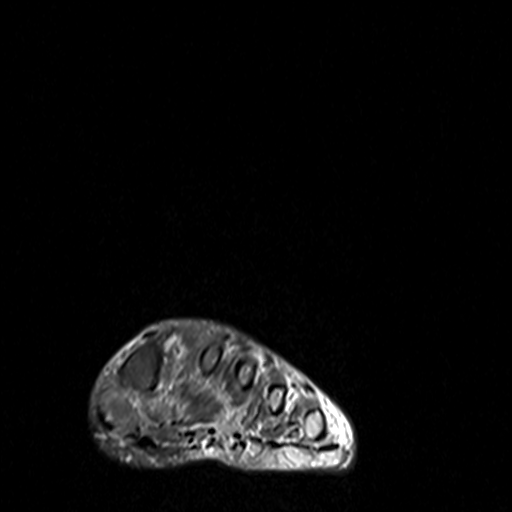
[im 11/42]
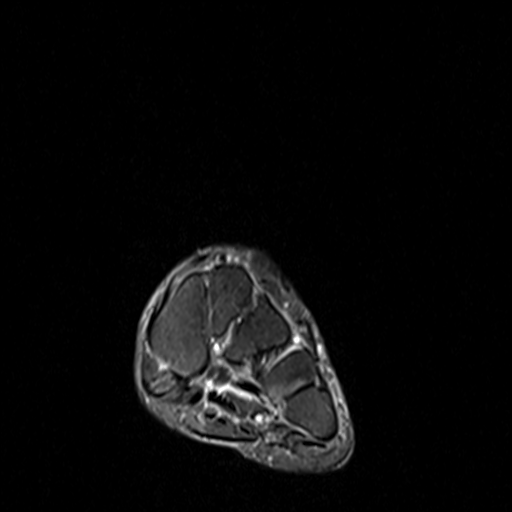
[im 21/42]
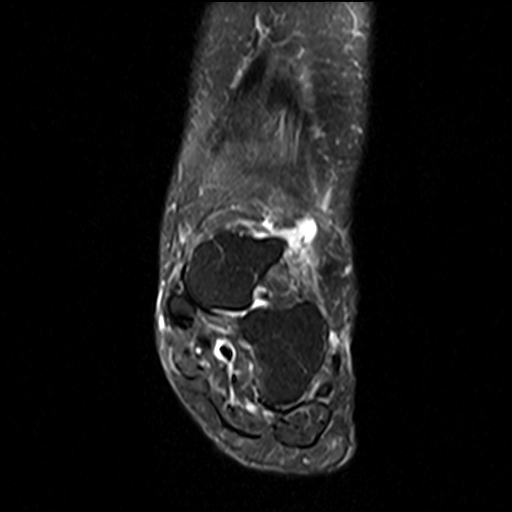
[im 31/42]
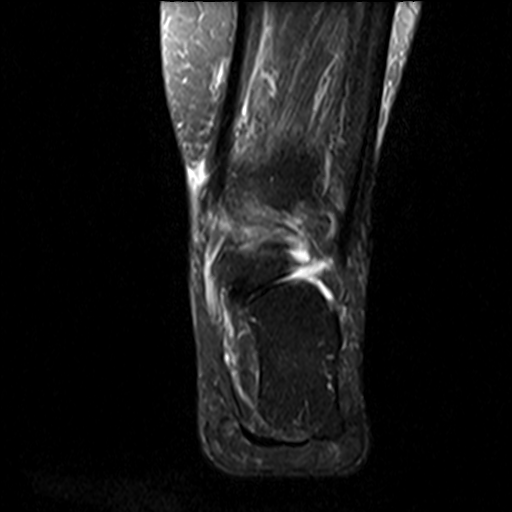
[im 42/42]
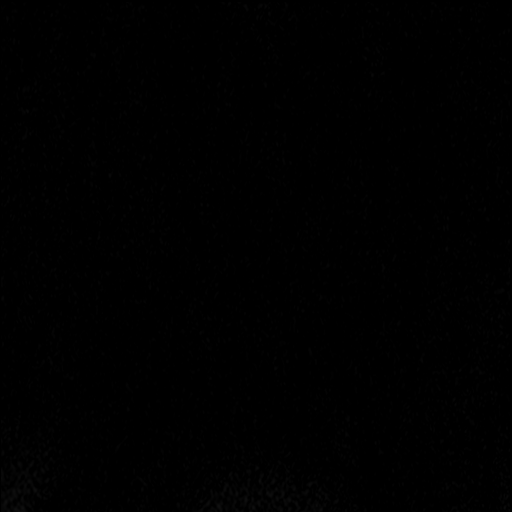

[Series 8: T1 fat-sat · axial · 3.0mm · 0.62mm/px · z∈[-97,+43]mm · 5 of 40 slices shown]
[im 1/40]
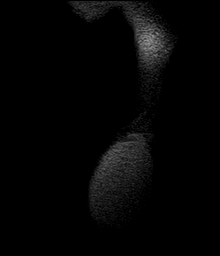
[im 10/40]
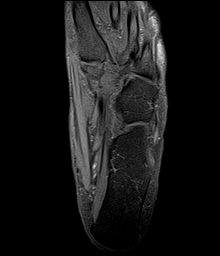
[im 20/40]
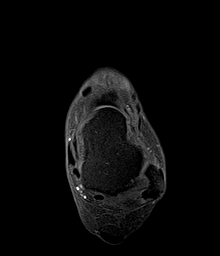
[im 30/40]
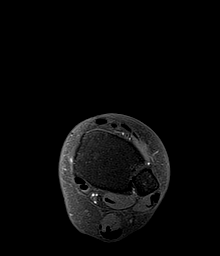
[im 40/40]
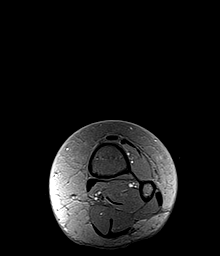

[Series 9: T1 post-contrast · axial · 3.0mm · 0.62mm/px · z∈[-97,+43]mm · 5 of 40 slices shown (1 of 2)]
[im 1/40]
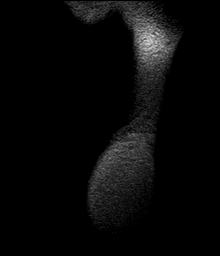
[im 10/40]
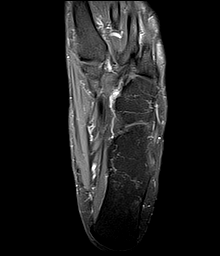
[im 20/40]
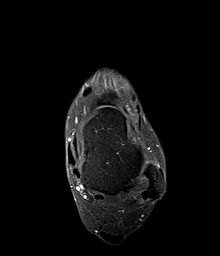
[im 30/40]
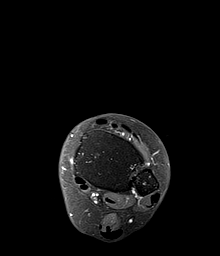
[im 40/40]
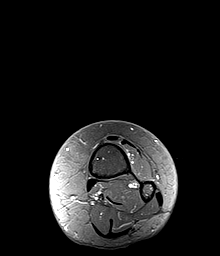

[Series 10: T1 post-contrast · sagittal · 3.0mm · 0.31mm/px · 3 of 22 slices shown (2 of 2)]
[im 1/22]
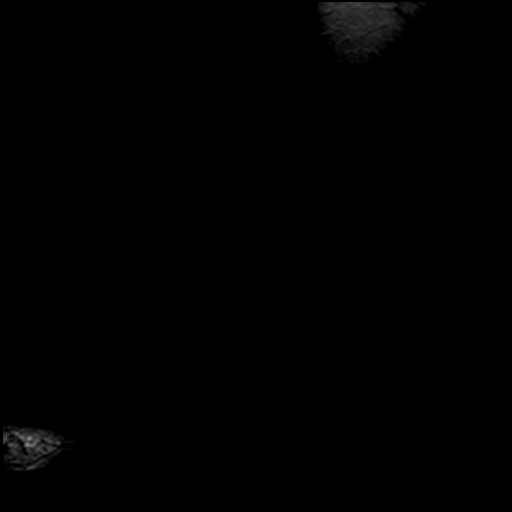
[im 11/22]
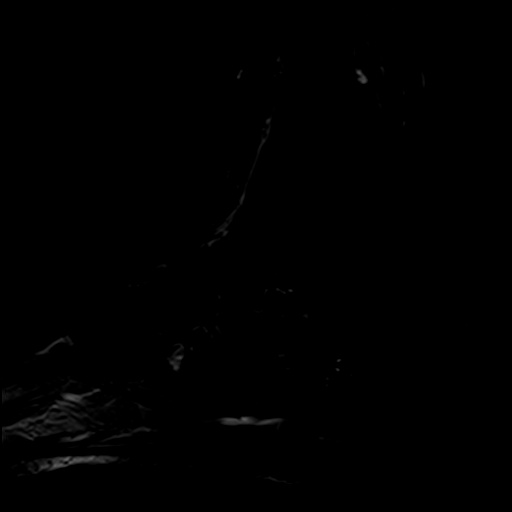
[im 22/22]
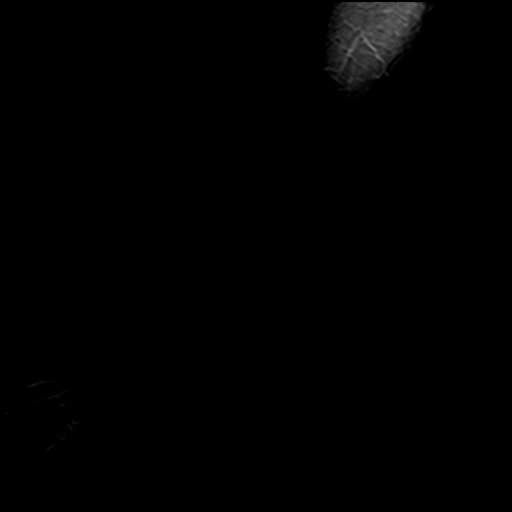

[Series 11: cor post · coronal · 3.0mm · 0.31mm/px · 1 of 42 slices shown]
[im 1/42]
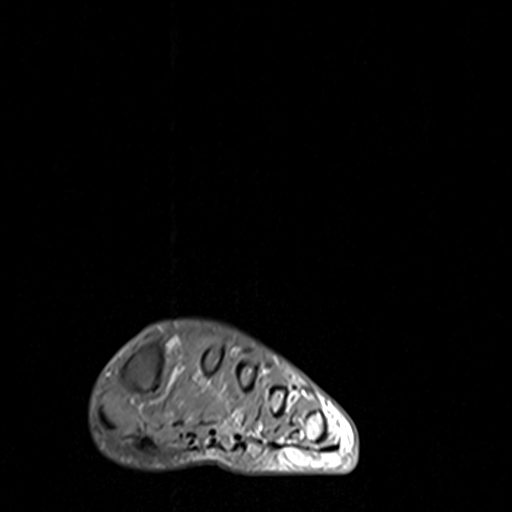

[36 of 40 positions shown; findings below may reference images not displayed]

FINDINGS: TENDONS

Peroneal: Peroneal longus tendon intact. Peroneal brevis intact.

Posteromedial: Posterior tibial tendon intact. Flexor hallucis
longus tendon intact. Flexor digitorum longus tendon intact.

Anterior: Tibialis anterior tendon intact. Extensor hallucis longus
tendon intact Extensor digitorum longus tendon intact.

Achilles:  Intact.

Plantar Fascia: Intact.

LIGAMENTS

Lateral: Attenuation of the anterior talofibular ligament at the
fibular attachment concerning for prior injury without complete
disruption. Calcaneofibular ligament intact. Posterior talofibular
ligament intact. Anterior and posterior tibiofibular ligaments
intact.

Medial: Deltoid ligament intact. Spring ligament intact.

CARTILAGE

Ankle Joint: No joint effusion. Normal ankle mortise. No chondral
defect.

Subtalar Joints/Sinus Tarsi: Normal subtalar joints. No subtalar
joint effusion. Normal sinus tarsi.

Bones: No marrow signal abnormality. No fracture or dislocation.
Incidental note made of an os naviculare.

Soft Tissue: No fluid collection or hematoma. Muscles are normal
without edema or atrophy. Tarsal tunnel is normal.
IMPRESSION: IMPRESSION
1. Attenuation of the anterior talofibular ligament at the fibular
attachment concerning for prior injury without complete disruption.
2. No soft tissue mass or focal abnormality at the site of clinical
concern overlying the medial malleolus.

## 2021-06-17 ENCOUNTER — Encounter (HOSPITAL_BASED_OUTPATIENT_CLINIC_OR_DEPARTMENT_OTHER): Payer: Self-pay | Admitting: Obstetrics & Gynecology

## 2021-06-17 ENCOUNTER — Ambulatory Visit (INDEPENDENT_AMBULATORY_CARE_PROVIDER_SITE_OTHER): Payer: BC Managed Care – PPO | Admitting: Obstetrics & Gynecology

## 2021-06-17 ENCOUNTER — Other Ambulatory Visit (HOSPITAL_COMMUNITY)
Admission: RE | Admit: 2021-06-17 | Discharge: 2021-06-17 | Disposition: A | Discharge status: Home / Self Care | Payer: BC Managed Care – PPO | Source: Ambulatory Visit | Attending: Obstetrics & Gynecology | Admitting: Obstetrics & Gynecology

## 2021-06-17 VITALS — BP 104/55 | HR 76 | Ht 68.0 in | Wt 132.8 lb

## 2021-06-17 DIAGNOSIS — Z01419 Encounter for gynecological examination (general) (routine) without abnormal findings: Secondary | ICD-10-CM | POA: Diagnosis not present

## 2021-06-17 DIAGNOSIS — Z170 Estrogen receptor positive status [ER+]: Secondary | ICD-10-CM

## 2021-06-17 DIAGNOSIS — Z124 Encounter for screening for malignant neoplasm of cervix: Secondary | ICD-10-CM

## 2021-06-17 DIAGNOSIS — M810 Age-related osteoporosis without current pathological fracture: Secondary | ICD-10-CM

## 2021-06-17 DIAGNOSIS — Z780 Asymptomatic menopausal state: Secondary | ICD-10-CM | POA: Diagnosis not present

## 2021-06-17 DIAGNOSIS — Z853 Personal history of malignant neoplasm of breast: Secondary | ICD-10-CM

## 2021-06-17 DIAGNOSIS — C50412 Malignant neoplasm of upper-outer quadrant of left female breast: Secondary | ICD-10-CM

## 2021-06-17 MED ORDER — ALENDRONATE SODIUM 70 MG PO TABS: ORAL_TABLET | ORAL | 4 refills | Status: DC
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
## 2021-06-18 LAB — CYTOLOGY - PAP: Diagnosis: NEGATIVE

## 2021-09-09 ENCOUNTER — Other Ambulatory Visit: Payer: Self-pay | Admitting: Obstetrics & Gynecology

## 2021-09-09 DIAGNOSIS — Z1231 Encounter for screening mammogram for malignant neoplasm of breast: Secondary | ICD-10-CM

## 2021-10-02 ENCOUNTER — Ambulatory Visit
Admission: RE | Admit: 2021-10-02 | Discharge: 2021-10-02 | Disposition: A | Discharge status: Home / Self Care | Payer: BC Managed Care – PPO | Source: Ambulatory Visit | Attending: Obstetrics & Gynecology | Admitting: Obstetrics & Gynecology

## 2021-10-02 DIAGNOSIS — Z1231 Encounter for screening mammogram for malignant neoplasm of breast: Secondary | ICD-10-CM

## 2022-01-20 ENCOUNTER — Other Ambulatory Visit (HOSPITAL_COMMUNITY): Payer: Self-pay | Admitting: Family Medicine

## 2022-01-20 DIAGNOSIS — E785 Hyperlipidemia, unspecified: Secondary | ICD-10-CM

## 2022-02-03 ENCOUNTER — Ambulatory Visit (HOSPITAL_COMMUNITY)
Admission: RE | Admit: 2022-02-03 | Discharge: 2022-02-03 | Disposition: A | Discharge status: Home / Self Care | Payer: BC Managed Care – PPO | Source: Ambulatory Visit | Attending: Family Medicine | Admitting: Family Medicine

## 2022-02-03 DIAGNOSIS — E785 Hyperlipidemia, unspecified: Secondary | ICD-10-CM | POA: Insufficient documentation

## 2022-08-29 ENCOUNTER — Other Ambulatory Visit (HOSPITAL_BASED_OUTPATIENT_CLINIC_OR_DEPARTMENT_OTHER): Payer: Self-pay | Admitting: Obstetrics & Gynecology

## 2022-08-29 DIAGNOSIS — M810 Age-related osteoporosis without current pathological fracture: Secondary | ICD-10-CM

## 2022-09-02 NOTE — Telephone Encounter (Signed)
LMOVM for pt to call regarding refill request. Pt needs appt 

## 2022-09-03 ENCOUNTER — Other Ambulatory Visit (HOSPITAL_BASED_OUTPATIENT_CLINIC_OR_DEPARTMENT_OTHER): Payer: Self-pay | Admitting: Obstetrics & Gynecology

## 2022-09-03 DIAGNOSIS — M810 Age-related osteoporosis without current pathological fracture: Secondary | ICD-10-CM

## 2022-09-11 ENCOUNTER — Other Ambulatory Visit: Payer: Self-pay | Admitting: Obstetrics & Gynecology

## 2022-09-11 DIAGNOSIS — Z1231 Encounter for screening mammogram for malignant neoplasm of breast: Secondary | ICD-10-CM

## 2022-10-08 ENCOUNTER — Ambulatory Visit
Admission: RE | Admit: 2022-10-08 | Discharge: 2022-10-08 | Disposition: A | Discharge status: Home / Self Care | Payer: BC Managed Care – PPO | Source: Ambulatory Visit | Attending: Obstetrics & Gynecology | Admitting: Obstetrics & Gynecology

## 2022-10-08 DIAGNOSIS — Z1231 Encounter for screening mammogram for malignant neoplasm of breast: Secondary | ICD-10-CM

## 2022-12-25 ENCOUNTER — Ambulatory Visit (INDEPENDENT_AMBULATORY_CARE_PROVIDER_SITE_OTHER): Payer: Medicare PPO | Admitting: Obstetrics & Gynecology

## 2022-12-25 ENCOUNTER — Encounter (HOSPITAL_BASED_OUTPATIENT_CLINIC_OR_DEPARTMENT_OTHER): Payer: Self-pay | Admitting: Obstetrics & Gynecology

## 2022-12-25 VITALS — BP 125/61 | HR 72 | Ht 67.5 in | Wt 130.0 lb

## 2022-12-25 DIAGNOSIS — Z780 Asymptomatic menopausal state: Secondary | ICD-10-CM | POA: Diagnosis not present

## 2022-12-25 DIAGNOSIS — C50412 Malignant neoplasm of upper-outer quadrant of left female breast: Secondary | ICD-10-CM | POA: Diagnosis not present

## 2022-12-25 DIAGNOSIS — Z170 Estrogen receptor positive status [ER+]: Secondary | ICD-10-CM

## 2022-12-25 DIAGNOSIS — M810 Age-related osteoporosis without current pathological fracture: Secondary | ICD-10-CM | POA: Diagnosis not present

## 2022-12-25 DIAGNOSIS — Z9189 Other specified personal risk factors, not elsewhere classified: Secondary | ICD-10-CM

## 2022-12-25 MED ORDER — ALENDRONATE SODIUM 70 MG PO TABS: ORAL_TABLET | ORAL | 3 refills | Status: DC
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
## 2023-01-22 DIAGNOSIS — Z0000 Encounter for general adult medical examination without abnormal findings: Secondary | ICD-10-CM | POA: Diagnosis not present

## 2023-01-22 DIAGNOSIS — E785 Hyperlipidemia, unspecified: Secondary | ICD-10-CM | POA: Diagnosis not present

## 2023-01-22 DIAGNOSIS — M549 Dorsalgia, unspecified: Secondary | ICD-10-CM | POA: Diagnosis not present

## 2023-01-22 DIAGNOSIS — Z1159 Encounter for screening for other viral diseases: Secondary | ICD-10-CM | POA: Diagnosis not present

## 2023-01-22 DIAGNOSIS — M810 Age-related osteoporosis without current pathological fracture: Secondary | ICD-10-CM | POA: Diagnosis not present

## 2023-01-22 DIAGNOSIS — I700 Atherosclerosis of aorta: Secondary | ICD-10-CM | POA: Diagnosis not present

## 2023-02-12 DIAGNOSIS — H402231 Chronic angle-closure glaucoma, bilateral, mild stage: Secondary | ICD-10-CM | POA: Diagnosis not present

## 2023-03-11 ENCOUNTER — Encounter (HOSPITAL_BASED_OUTPATIENT_CLINIC_OR_DEPARTMENT_OTHER): Payer: Self-pay | Admitting: Physical Therapy

## 2023-03-11 ENCOUNTER — Ambulatory Visit (HOSPITAL_BASED_OUTPATIENT_CLINIC_OR_DEPARTMENT_OTHER): Payer: Medicare PPO | Attending: Family Medicine | Admitting: Physical Therapy

## 2023-03-11 ENCOUNTER — Other Ambulatory Visit: Payer: Self-pay

## 2023-03-11 DIAGNOSIS — M546 Pain in thoracic spine: Secondary | ICD-10-CM | POA: Diagnosis not present

## 2023-03-11 DIAGNOSIS — R293 Abnormal posture: Secondary | ICD-10-CM | POA: Diagnosis not present

## 2023-03-11 DIAGNOSIS — M6281 Muscle weakness (generalized): Secondary | ICD-10-CM | POA: Diagnosis not present
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
## 2023-03-19 ENCOUNTER — Ambulatory Visit (HOSPITAL_BASED_OUTPATIENT_CLINIC_OR_DEPARTMENT_OTHER): Payer: Medicare PPO | Admitting: Physical Therapy

## 2023-03-19 ENCOUNTER — Encounter (HOSPITAL_BASED_OUTPATIENT_CLINIC_OR_DEPARTMENT_OTHER): Payer: Self-pay | Admitting: Physical Therapy

## 2023-03-19 DIAGNOSIS — R293 Abnormal posture: Secondary | ICD-10-CM | POA: Diagnosis not present

## 2023-03-19 DIAGNOSIS — M6281 Muscle weakness (generalized): Secondary | ICD-10-CM

## 2023-03-19 DIAGNOSIS — M546 Pain in thoracic spine: Secondary | ICD-10-CM | POA: Diagnosis not present
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
## 2023-04-07 ENCOUNTER — Encounter (HOSPITAL_BASED_OUTPATIENT_CLINIC_OR_DEPARTMENT_OTHER): Payer: Self-pay | Admitting: Physical Therapy

## 2023-04-07 ENCOUNTER — Ambulatory Visit (HOSPITAL_BASED_OUTPATIENT_CLINIC_OR_DEPARTMENT_OTHER): Payer: Medicare PPO | Attending: Family Medicine | Admitting: Physical Therapy

## 2023-04-07 DIAGNOSIS — M6281 Muscle weakness (generalized): Secondary | ICD-10-CM | POA: Diagnosis not present

## 2023-04-07 DIAGNOSIS — R293 Abnormal posture: Secondary | ICD-10-CM | POA: Diagnosis not present

## 2023-04-07 DIAGNOSIS — M546 Pain in thoracic spine: Secondary | ICD-10-CM | POA: Diagnosis not present
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
## 2023-04-13 ENCOUNTER — Ambulatory Visit (HOSPITAL_BASED_OUTPATIENT_CLINIC_OR_DEPARTMENT_OTHER): Payer: Medicare PPO | Admitting: Physical Therapy

## 2023-04-13 ENCOUNTER — Encounter (HOSPITAL_BASED_OUTPATIENT_CLINIC_OR_DEPARTMENT_OTHER): Payer: Self-pay | Admitting: Physical Therapy

## 2023-04-13 DIAGNOSIS — M6281 Muscle weakness (generalized): Secondary | ICD-10-CM

## 2023-04-13 DIAGNOSIS — R293 Abnormal posture: Secondary | ICD-10-CM | POA: Diagnosis not present

## 2023-04-13 DIAGNOSIS — M546 Pain in thoracic spine: Secondary | ICD-10-CM | POA: Diagnosis not present
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
## 2023-04-27 ENCOUNTER — Encounter (HOSPITAL_BASED_OUTPATIENT_CLINIC_OR_DEPARTMENT_OTHER): Payer: Self-pay | Admitting: Physical Therapy

## 2023-04-27 ENCOUNTER — Ambulatory Visit (HOSPITAL_BASED_OUTPATIENT_CLINIC_OR_DEPARTMENT_OTHER): Payer: Medicare PPO | Admitting: Physical Therapy

## 2023-04-27 DIAGNOSIS — R293 Abnormal posture: Secondary | ICD-10-CM

## 2023-04-27 DIAGNOSIS — M6281 Muscle weakness (generalized): Secondary | ICD-10-CM | POA: Diagnosis not present

## 2023-04-27 DIAGNOSIS — M546 Pain in thoracic spine: Secondary | ICD-10-CM | POA: Diagnosis not present
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
## 2023-05-04 ENCOUNTER — Ambulatory Visit (HOSPITAL_BASED_OUTPATIENT_CLINIC_OR_DEPARTMENT_OTHER): Payer: Medicare PPO | Admitting: Physical Therapy

## 2023-05-09 ENCOUNTER — Ambulatory Visit (HOSPITAL_BASED_OUTPATIENT_CLINIC_OR_DEPARTMENT_OTHER): Payer: Medicare PPO | Admitting: Physical Therapy

## 2023-05-09 ENCOUNTER — Encounter (HOSPITAL_BASED_OUTPATIENT_CLINIC_OR_DEPARTMENT_OTHER): Payer: Self-pay

## 2023-06-06 ENCOUNTER — Encounter (HOSPITAL_BASED_OUTPATIENT_CLINIC_OR_DEPARTMENT_OTHER): Payer: Self-pay | Admitting: Obstetrics & Gynecology

## 2023-08-12 DIAGNOSIS — J0110 Acute frontal sinusitis, unspecified: Secondary | ICD-10-CM | POA: Diagnosis not present

## 2023-08-26 ENCOUNTER — Other Ambulatory Visit: Payer: Medicare PPO

## 2023-09-07 ENCOUNTER — Other Ambulatory Visit: Payer: Self-pay | Admitting: Family Medicine

## 2023-09-07 DIAGNOSIS — Z1231 Encounter for screening mammogram for malignant neoplasm of breast: Secondary | ICD-10-CM

## 2023-09-21 ENCOUNTER — Ambulatory Visit (HOSPITAL_BASED_OUTPATIENT_CLINIC_OR_DEPARTMENT_OTHER)
Admission: RE | Admit: 2023-09-21 | Discharge: 2023-09-21 | Disposition: A | Discharge status: Home / Self Care | Source: Ambulatory Visit | Attending: Obstetrics & Gynecology | Admitting: Obstetrics & Gynecology

## 2023-09-21 DIAGNOSIS — M810 Age-related osteoporosis without current pathological fracture: Secondary | ICD-10-CM | POA: Diagnosis not present

## 2023-09-21 DIAGNOSIS — Z780 Asymptomatic menopausal state: Secondary | ICD-10-CM | POA: Diagnosis not present

## 2023-09-24 ENCOUNTER — Ambulatory Visit (HOSPITAL_BASED_OUTPATIENT_CLINIC_OR_DEPARTMENT_OTHER): Payer: Self-pay | Admitting: Obstetrics & Gynecology

## 2023-09-24 ENCOUNTER — Encounter (HOSPITAL_BASED_OUTPATIENT_CLINIC_OR_DEPARTMENT_OTHER): Payer: Self-pay | Admitting: Obstetrics & Gynecology

## 2023-10-09 ENCOUNTER — Ambulatory Visit
Admission: RE | Admit: 2023-10-09 | Discharge: 2023-10-09 | Disposition: A | Discharge status: Home / Self Care | Source: Ambulatory Visit | Attending: Family Medicine | Admitting: Family Medicine

## 2023-10-09 DIAGNOSIS — Z1231 Encounter for screening mammogram for malignant neoplasm of breast: Secondary | ICD-10-CM | POA: Diagnosis not present

## 2023-11-26 DIAGNOSIS — H402231 Chronic angle-closure glaucoma, bilateral, mild stage: Secondary | ICD-10-CM | POA: Diagnosis not present

## 2023-11-26 DIAGNOSIS — H35363 Drusen (degenerative) of macula, bilateral: Secondary | ICD-10-CM | POA: Diagnosis not present

## 2023-11-26 DIAGNOSIS — H43811 Vitreous degeneration, right eye: Secondary | ICD-10-CM | POA: Diagnosis not present

## 2023-11-26 DIAGNOSIS — H35413 Lattice degeneration of retina, bilateral: Secondary | ICD-10-CM | POA: Diagnosis not present

## 2023-11-26 DIAGNOSIS — H524 Presbyopia: Secondary | ICD-10-CM | POA: Diagnosis not present

## 2023-12-02 DIAGNOSIS — L821 Other seborrheic keratosis: Secondary | ICD-10-CM | POA: Diagnosis not present

## 2023-12-02 DIAGNOSIS — Z85828 Personal history of other malignant neoplasm of skin: Secondary | ICD-10-CM | POA: Diagnosis not present

## 2023-12-02 DIAGNOSIS — D1801 Hemangioma of skin and subcutaneous tissue: Secondary | ICD-10-CM | POA: Diagnosis not present

## 2023-12-02 DIAGNOSIS — L249 Irritant contact dermatitis, unspecified cause: Secondary | ICD-10-CM | POA: Diagnosis not present

## 2023-12-02 DIAGNOSIS — L814 Other melanin hyperpigmentation: Secondary | ICD-10-CM | POA: Diagnosis not present

## 2023-12-02 DIAGNOSIS — Z08 Encounter for follow-up examination after completed treatment for malignant neoplasm: Secondary | ICD-10-CM | POA: Diagnosis not present

## 2024-01-17 ENCOUNTER — Other Ambulatory Visit (HOSPITAL_BASED_OUTPATIENT_CLINIC_OR_DEPARTMENT_OTHER): Payer: Self-pay | Admitting: Obstetrics & Gynecology

## 2024-01-17 DIAGNOSIS — M810 Age-related osteoporosis without current pathological fracture: Secondary | ICD-10-CM
